# Patient Record
Sex: Male | Born: 2000
Health system: Southern US, Community
[De-identification: ages and names within clinical notes are randomized; demographics above are authoritative.]

## PROBLEM LIST (undated history)

## (undated) DIAGNOSIS — F32A Depression, unspecified: Secondary | ICD-10-CM

## (undated) DIAGNOSIS — L309 Dermatitis, unspecified: Secondary | ICD-10-CM

## (undated) DIAGNOSIS — K219 Gastro-esophageal reflux disease without esophagitis: Secondary | ICD-10-CM

## (undated) DIAGNOSIS — J309 Allergic rhinitis, unspecified: Secondary | ICD-10-CM

## (undated) DIAGNOSIS — J45909 Unspecified asthma, uncomplicated: Secondary | ICD-10-CM

## (undated) DIAGNOSIS — R319 Hematuria, unspecified: Secondary | ICD-10-CM

## (undated) DIAGNOSIS — T783XXA Angioneurotic edema, initial encounter: Secondary | ICD-10-CM

## (undated) DIAGNOSIS — L509 Urticaria, unspecified: Secondary | ICD-10-CM

## (undated) DIAGNOSIS — F419 Anxiety disorder, unspecified: Secondary | ICD-10-CM

## (undated) HISTORY — DX: Anxiety disorder, unspecified: F41.9

## (undated) HISTORY — DX: Unspecified asthma, uncomplicated: J45.909

## (undated) HISTORY — PX: TYMPANOSTOMY TUBE PLACEMENT: SHX32

## (undated) HISTORY — DX: Gastro-esophageal reflux disease without esophagitis: K21.9

## (undated) HISTORY — DX: Angioneurotic edema, initial encounter: T78.3XXA

## (undated) HISTORY — PX: OTHER SURGICAL HISTORY: SHX169

## (undated) HISTORY — DX: Urticaria, unspecified: L50.9

## (undated) HISTORY — DX: Allergic rhinitis, unspecified: J30.9

## (undated) HISTORY — DX: Dermatitis, unspecified: L30.9

## (undated) HISTORY — DX: Depression, unspecified: F32.A

## (undated) HISTORY — DX: Hematuria, unspecified: R31.9

---
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
## 2020-04-29 ENCOUNTER — Other Ambulatory Visit: Payer: Commercial Managed Care - PPO

## 2020-04-29 DIAGNOSIS — D802 Selective deficiency of immunoglobulin A [IgA]: Secondary | ICD-10-CM

## 2020-04-29 DIAGNOSIS — K900 Celiac disease: Secondary | ICD-10-CM

## 2020-04-30 LAB — TISSUE TRANSGLUTAMINASE, IGG: (tTG) Ab, IgG: 44.9 U/mL — ABNORMAL HIGH

## 2020-07-04 ENCOUNTER — Ambulatory Visit: Payer: Commercial Managed Care - PPO | Admitting: Internal Medicine

## 2020-07-04 ENCOUNTER — Other Ambulatory Visit (INDEPENDENT_AMBULATORY_CARE_PROVIDER_SITE_OTHER): Payer: Commercial Managed Care - PPO

## 2020-07-04 ENCOUNTER — Encounter: Payer: Self-pay | Admitting: Internal Medicine

## 2020-07-04 VITALS — BP 120/78 | HR 105 | Ht 65.25 in | Wt 124.0 lb

## 2020-07-04 DIAGNOSIS — D802 Selective deficiency of immunoglobulin A [IgA]: Secondary | ICD-10-CM | POA: Diagnosis not present

## 2020-07-04 DIAGNOSIS — K900 Celiac disease: Secondary | ICD-10-CM

## 2020-07-04 DIAGNOSIS — D804 Selective deficiency of immunoglobulin M [IgM]: Secondary | ICD-10-CM | POA: Diagnosis not present

## 2020-07-04 LAB — COMPREHENSIVE METABOLIC PANEL
ALT: 12 U/L (ref 0–53)
AST: 11 U/L (ref 0–37)
Albumin: 5.3 g/dL — ABNORMAL HIGH (ref 3.5–5.2)
Alkaline Phosphatase: 102 U/L (ref 52–171)
BUN: 15 mg/dL (ref 6–23)
CO2: 25 mEq/L (ref 19–32)
Calcium: 10.4 mg/dL (ref 8.4–10.5)
Chloride: 103 mEq/L (ref 96–112)
Creatinine, Ser: 0.83 mg/dL (ref 0.40–1.50)
GFR: 126.87 mL/min (ref >60.00)
Glucose, Bld: 98 mg/dL (ref 70–99)
Potassium: 3.8 mEq/L (ref 3.5–5.1)
Sodium: 139 mEq/L (ref 135–145)
Total Bilirubin: 0.9 mg/dL (ref 0.2–1.2)
Total Protein: 7.6 g/dL (ref 6.0–8.3)

## 2020-07-04 LAB — CBC
HCT: 46.4 % (ref 36.0–49.0)
Hemoglobin: 16.3 g/dL — ABNORMAL HIGH (ref 12.0–16.0)
MCHC: 35.1 g/dL (ref 31.0–37.0)
MCV: 83.6 fl (ref 78.0–98.0)
Platelets: 291 10*3/uL (ref 150.0–575.0)
RBC: 5.54 Mil/uL (ref 3.80–5.70)
RDW: 12.7 % (ref 11.4–15.5)
WBC: 6.8 10*3/uL (ref 4.5–13.5)

## 2020-07-04 LAB — VITAMIN D 25 HYDROXY (VIT D DEFICIENCY, FRACTURES): VITD: 12.37 ng/mL — ABNORMAL LOW (ref 30.00–100.00)

## 2020-07-04 LAB — VITAMIN B12: Vitamin B-12: 484 pg/mL (ref 211–911)

## 2020-07-04 LAB — FERRITIN: Ferritin: 43.5 ng/mL (ref 22.0–322.0)

## 2020-07-04 NOTE — Patient Instructions (Addendum)
Your provider has requested that you go to the basement level for lab work before leaving today. Press "B" on the elevator. The lab is located at the first door on the left as you exit the elevator  Due to recent changes in healthcare laws, you may see the results of your imaging and laboratory studies on MyChart before your provider has had a chance to review them.  We understand that in some cases there may be results that are confusing or concerning to you. Not all laboratory results come back in the same time frame and the provider may be waiting for multiple results in order to interpret others.  Please give [place] 48 hours in order for your provider to thoroughly review all the results before contacting the office for clarification of your results.   Referral will be sent to Celiac Clinic at [facility]. Dyanne Iha- Office will contact you with an appointment.   Referral has also been sent to  Nutritionist -Jacquelin Hawking RD, LDN. Office will contact you with an appointment.   If you have had not heard from either office within 1-2 weeks, please contact our office.    If you are age 72 or younger, your body mass index should be between 19-25. Your Body mass index is 20.48 kg/m. If this is out of the aformentioned range listed, please consider follow up with your Primary Care Provider.   Thank you for choosing me and [facility].  Dr.Carl Leone Payor

## 2020-07-04 NOTE — Assessment & Plan Note (Signed)
Serology is improved.  Weight is up.  Clinically he is better.  However he is having some food avoidance issues.  I think he would benefit from a celiac expert consultation so we will refer him to Dr. Loreen Freud at [facility].  Dietitian referral with celiac expertise as well.  That is Jacquelin Hawking who works in the celiac clinic at [facility] from what I saw.  He could potentially follow there for a while I can see him back but I know they can offer him more than I can.  His celiac support group may be useful as well.

## 2020-07-04 NOTE — Progress Notes (Signed)
   Bradley Esparza 20 y.o. 2000-03-23 [id number]  Assessment & Plan:   Celiac disease Serology is improved.  Weight is up.  Clinically he is better.  However he is having some food avoidance issues.  I think he would benefit from a celiac expert consultation so we will refer him to Dr. Loreen Freud at [facility].  Dietitian referral with celiac expertise as well.  That is Jacquelin Hawking who works in the celiac clinic at [facility] from what I saw.  He could potentially follow there for a while I can see him back but I know they can offer him more than I can.  His celiac support group may be useful as well.    Low serum IgA and IgM levels (HCC) Has seen allergy and immunology     Subjective:   Chief Complaint: Follow-up of celiac disease  HPI Bradley Esparza is here with his mom.  He has a diagnosis of celiac disease and IgA deficiency.  He reports that he is doing better.  Mom says that he is not eating 3 meals a day and when I questioned him he will skip meals either because he is not hungry but also because it is a struggle to try to figure out what to eat.  His TTG antibody levels are coming down, in September they were 209.5 and in February 44.9.  Weight is up.  School is going well.  Hoping to work this summer.  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 Wt Readings from Last 3 Encounters:  07/04/20 124 lb (56.2 kg) (6 %, Z= -1.52)*  11/28/19 119 lb (54 kg) (4 %, Z= -1.74)*  08/10/19 118 lb 8 oz (53.8 kg) (4 %, Z= -1.71)*   * Growth percentiles are based on CDC (Boys, 2-20 Years) data.     Allergies  Allergen Reactions  . Augmentin [Amoxicillin-Pot Clavulanate] Other (See Comments)    urticaria  . Gluten Meal   . Peanut-Containing Drug Products    No outpatient medications have been marked as taking for the 07/04/20 encounter (Office Visit) with Iva Boop, MD.   Past Medical History:  Diagnosis Date  . Allergic rhinitis   . Angio-edema   . Asthma   . Celiac disease 05/23/2019  . Eczema    . GERD (gastroesophageal reflux disease)   . Hematuria   . 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  . Urticaria    Past Surgical History:  Procedure Laterality Date  . COLONOSCOPY  05/2019  . ESOPHAGOGASTRODUODENOSCOPY  05/2019  . TYMPANOSTOMY TUBE PLACEMENT     Social History   Social History Narrative   2021 graduate [employer].     G [employer] student 2021    never smoker no school problems lives with mom and younger sister   family history includes Allergic rhinitis in his father, mother, and sister; Asthma in his father, mother, paternal grandfather, and sister; Eczema in his father, mother, and sister; GER disease in his father and maternal grandmother; Irritable bowel syndrome in his maternal grandmother and maternal great-grandmother; Urticaria in his mother.   Review of Systems  As per HPI Objective:   Physical Exam BP 120/78   Pulse (!) 105   Ht 5' 5.25" (1.657 m)   Wt 124 lb (56.2 kg)   SpO2 98%   BMI 20.48 kg/m

## 2020-07-04 NOTE — Assessment & Plan Note (Signed)
Has seen allergy and immunology

## 2020-07-05 LAB — TISSUE TRANSGLUTAMINASE, IGA: (tTG) Ab, IgA: <1 U/mL

## 2020-07-05 LAB — TISSUE TRANSGLUTAMINASE, IGG: (tTG) Ab, IgG: 32.7 U/mL — ABNORMAL HIGH

## 2020-07-08 ENCOUNTER — Other Ambulatory Visit: Payer: Self-pay | Admitting: Internal Medicine

## 2020-07-08 ENCOUNTER — Encounter: Payer: Self-pay | Admitting: Internal Medicine

## 2020-07-08 ENCOUNTER — Other Ambulatory Visit (HOSPITAL_COMMUNITY): Payer: Self-pay

## 2020-07-08 ENCOUNTER — Telehealth: Payer: Self-pay

## 2020-07-08 DIAGNOSIS — E559 Vitamin D deficiency, unspecified: Secondary | ICD-10-CM

## 2020-07-08 HISTORY — DX: Vitamin D deficiency, unspecified: E55.9

## 2020-07-08 MED ORDER — VITAMIN D (ERGOCALCIFEROL) 1.25 MG (50000 UNIT) PO CAPS
50000.0000 [IU] | ORAL_CAPSULE | ORAL | 0 refills | Status: AC
  Filled 2020-07-08: qty 4, 28d supply, fill #0
  Filled 2020-08-08: qty 4, 28d supply, fill #1

## 2020-07-08 NOTE — Telephone Encounter (Signed)
Referral for Celiac disease and Nutrition faxed to [phone]. Notes, labs and procedure reports attached to referral. Pt knows to contact office if he does not here from their office in 1-2 weeks regarding referrals.

## 2020-07-11 ENCOUNTER — Other Ambulatory Visit (HOSPITAL_COMMUNITY): Payer: Self-pay

## 2020-07-17 ENCOUNTER — Telehealth: Payer: Self-pay | Admitting: Allergy

## 2020-07-17 NOTE — Telephone Encounter (Signed)
Spoke with pts mom they want to come on and get labs drawn. I have had Doreen Beam in the [place] office print off the request form for me and give it to rochelle as pts mom is going to bring him in tomorrow afternoon for his lab draw so they have results back before visit.

## 2020-07-17 NOTE — Telephone Encounter (Signed)
Patient was seen 08-08-19 and Dr. Selena Batten ordered blood tests for Bradley Esparza. Mom called today and said they never went to have this done and he has an upcoming appointment on 08-07-20. She wants to know if new orders need to be submitted so he can get this done before the appointment.

## 2020-08-01 LAB — STREP PNEUMONIAE 23 SEROTYPES IGG
Pneumo Ab Type 1*: <0.1 ug/mL — ABNORMAL LOW (ref >1.3)
Pneumo Ab Type 12 (12F)*: <0.1 ug/mL — ABNORMAL LOW (ref >1.3)
Pneumo Ab Type 14*: 0.1 ug/mL — ABNORMAL LOW (ref >1.3)
Pneumo Ab Type 17 (17F)*: <0.1 ug/mL — ABNORMAL LOW (ref >1.3)
Pneumo Ab Type 19 (19F)*: 0.5 ug/mL — ABNORMAL LOW (ref >1.3)
Pneumo Ab Type 2*: 0.3 ug/mL — ABNORMAL LOW (ref >1.3)
Pneumo Ab Type 20*: <0.1 ug/mL — ABNORMAL LOW (ref >1.3)
Pneumo Ab Type 22 (22F)*: 0.2 ug/mL — ABNORMAL LOW (ref >1.3)
Pneumo Ab Type 23 (23F)*: 0.1 ug/mL — ABNORMAL LOW (ref >1.3)
Pneumo Ab Type 26 (6B)*: 0.5 ug/mL — ABNORMAL LOW (ref >1.3)
Pneumo Ab Type 3*: 1.6 ug/mL (ref >1.3)
Pneumo Ab Type 34 (10A)*: 0.3 ug/mL — ABNORMAL LOW (ref >1.3)
Pneumo Ab Type 4*: <0.1 ug/mL — ABNORMAL LOW (ref >1.3)
Pneumo Ab Type 43 (11A)*: 0.4 ug/mL — ABNORMAL LOW (ref >1.3)
Pneumo Ab Type 5*: <0.1 ug/mL — ABNORMAL LOW (ref >1.3)
Pneumo Ab Type 51 (7F)*: <0.1 ug/mL — ABNORMAL LOW (ref >1.3)
Pneumo Ab Type 54 (15B)*: 0.4 ug/mL — ABNORMAL LOW (ref >1.3)
Pneumo Ab Type 56 (18C)*: <0.1 ug/mL — ABNORMAL LOW (ref >1.3)
Pneumo Ab Type 57 (19A)*: 0.7 ug/mL — ABNORMAL LOW (ref >1.3)
Pneumo Ab Type 68 (9V)*: <0.1 ug/mL — ABNORMAL LOW (ref >1.3)
Pneumo Ab Type 70 (33F)*: 0.2 ug/mL — ABNORMAL LOW (ref >1.3)
Pneumo Ab Type 8*: 0.3 ug/mL — ABNORMAL LOW (ref >1.3)
Pneumo Ab Type 9 (9N)*: <0.1 ug/mL — ABNORMAL LOW (ref >1.3)

## 2020-08-01 LAB — CBC WITH DIFFERENTIAL/PLATELET
Basophils Absolute: 0.1 10*3/uL (ref 0.0–0.2)
Basos: 2 %
EOS (ABSOLUTE): 0.4 10*3/uL (ref 0.0–0.4)
Eos: 6 %
Hematocrit: 49.5 % (ref 37.5–51.0)
Hemoglobin: 17.2 g/dL (ref 13.0–17.7)
Immature Grans (Abs): 0 10*3/uL (ref 0.0–0.1)
Immature Granulocytes: 0 %
Lymphocytes Absolute: 2.5 10*3/uL (ref 0.7–3.1)
Lymphs: 36 %
MCH: 29.5 pg (ref 26.6–33.0)
MCHC: 34.7 g/dL (ref 31.5–35.7)
MCV: 85 fL (ref 79–97)
Monocytes Absolute: 0.7 10*3/uL (ref 0.1–0.9)
Monocytes: 10 %
Neutrophils Absolute: 3.2 10*3/uL (ref 1.4–7.0)
Neutrophils: 46 %
Platelets: 313 10*3/uL (ref 150–450)
RBC: 5.84 x10E6/uL — ABNORMAL HIGH (ref 4.14–5.80)
RDW: 12 % (ref 11.6–15.4)
WBC: 6.8 10*3/uL (ref 3.4–10.8)

## 2020-08-01 LAB — IGE PEANUT W/COMPONENT REFLEX: Peanut, IgE: <0.1 kU/L

## 2020-08-01 LAB — IGG 1, 2, 3, AND 4
IgG (Immunoglobin G), Serum: 641 mg/dL — ABNORMAL LOW (ref 671–1456)
IgG, Subclass 1: 369 mg/dL (ref 325–846)
IgG, Subclass 2: 137 mg/dL (ref 133–509)
IgG, Subclass 3: 96 mg/dL (ref 19–109)
IgG, Subclass 4: 3 mg/dL (ref 3–104)

## 2020-08-01 LAB — COMPLEMENT, TOTAL: Compl, Total (CH50): 58 U/mL (ref >41)

## 2020-08-01 LAB — DIPHTHERIA / TETANUS ANTIBODY PANEL
Diphtheria Ab: 0.19 IU/mL (ref <0.10)
Tetanus Ab, IgG: 0.21 IU/mL (ref <0.10)

## 2020-08-06 NOTE — Progress Notes (Signed)
Follow Up Note  RE: Bradley Esparza MRN: [id number] DOB: [birth year] Date of Office Visit: 08/07/2020  Referring provider: Aggie Hacker, MD Primary care provider: Pcp, No  Chief Complaint: No chief complaint on file.  History of Present Illness: I had the pleasure of seeing Bradley Esparza for a follow up visit at the [facility] on 08/07/2020. He is a 20 y.o. male, who is being followed for low IgA and IgM, adverse food reaction and history of asthma. His previous allergy office visit was on 08/08/2019 with Dr. Selena Batten. Today is a regular follow up visit.  He is accompanied today by his mother who provided/contributed to the history.   Low serum IgA and IgM levels No antibiotics since the last visit. Had some URI symptoms which resolved without any antibiotics.  Celiac's is under control with avoiding gluten.  No blood transfusion.  Not up to date with flu/covid-19 vaccine.  Adverse food reaction Currently avoiding peanuts and no reactions.  History of asthma Denies any SOB, coughing, wheezing, chest tightness, nocturnal awakenings, ER/urgent care visits or prednisone use since the last visit.  Assessment and Plan: Bradley Esparza is a 20 y.o. male with: Low serum IgA and IgM levels (HCC) Past history - Diagnosed with Celiac's disease and found to have low IgA and IgM. Patient has history of frequent sinus infections, pneumonias, ear infections s/p tympanostomy tubes as a child. Labs 07/20/2019 IgG 668, IgA <2, IgM 31.  Interim history - 2022 IgG 641 and absent pneumococcal titers. No antibiotics the last year.  Keep track of infections/antibiotics use. Recheck IgG level in 1 year - lower end of normal.  Get pneumonia vaccine (pneumovax) at PCP or pharmacy.  If they can't give then let us know and we can check if your insurance will allow [place] to give it to you.  Get bloodwork 4 weeks after pneumonia vaccine to re-check levels. Recommend annual flu vaccine/covid-19  vaccinations.  There is a risk for infusion reactions with blood products in IgA deficient patient and advised him to be aware of this in case he needs them in the future. No past blood transfusions.  Adverse food reaction Past history - Peanuts caused hives and swelling in the past. Tested by [place] allergy in the past.  Interim history - 2022 bloodwork negative to peanuts.   Today's skin testing negative to peanuts.  Continue strict avoidance of peanuts.  If interested we can schedule food challenge to peanuts. You must be off antihistamines for 3-5 days before. Must be in good health and not ill. No vaccines/injections within the past 7 days. Not on any antibiotics. Plan on being in the office for 2-3 hours and must bring in the food you want to do the oral challenge for. You must call to schedule an appointment and specify it's for a food challenge.   For mild symptoms you can take over the counter antihistamines such as Benadryl and monitor symptoms closely. If symptoms worsen or if you have severe symptoms including breathing issues, throat closure, significant swelling, whole body hives, severe diarrhea and vomiting, lightheadedness then inject epinephrine and seek immediate medical care afterwards.  Food action plan in place.  History of asthma Past history - History of asthma but no symptoms and no inhaler use for a few years.  Monitor symptoms.   Return in about 1 year (around 08/07/2021).  No orders of the defined types were placed in this encounter.   Lab Orders  Strep pneumoniae 23 Serotypes IgG  Diagnostics: Skin Testing: Select foods. Negative test to: peanuts.  Results discussed with patient/family.  Food Adult Perc - 08/07/20 1600    Time Antigen Placed 1630    Allergen Manufacturer Waynette Buttery    Location Back    Number of allergen test 3     Control-buffer 50% Glycerol Negative    Control-Histamine 1 mg/ml 2+    1. Peanut Negative           Medication  List:  Current Outpatient Medications  Medication Sig Dispense Refill  . EPINEPHrine 0.3 mg/0.3 mL IJ SOAJ injection Inject 0.3 mg into the muscle as needed for anaphylaxis.    . Vitamin D, Ergocalciferol, (DRISDOL) 1.25 MG (50000 UNIT) CAPS capsule Take 1 capsule (50,000 Units total) by mouth every 7 (seven) days for 8 doses. 8 capsule 0   No current facility-administered medications for this visit.   Allergies: Allergies  Allergen Reactions  . Augmentin [Amoxicillin-Pot Clavulanate] Other (See Comments)    urticaria  . Gluten Meal   . Peanut-Containing Drug Products   . Penicillins Hives and Swelling   I reviewed his past medical history, social history, family history, and environmental history and no significant changes have been reported from his previous visit.  Review of Systems  Constitutional: Negative for appetite change, chills, fever and unexpected weight change.  HENT: Negative for congestion and rhinorrhea.   Eyes: Negative for itching.  Respiratory: Negative for cough, chest tightness, shortness of breath and wheezing.   Cardiovascular: Negative for chest pain.  Gastrointestinal: Negative for abdominal pain.  Genitourinary: Negative for difficulty urinating.  Skin: Negative for rash.  Allergic/Immunologic: Positive for food allergies.  Neurological: Negative for headaches.   Objective: BP 114/60   Pulse (!) 107   Temp 99 F (37.2 C) (Temporal)   Ht 5' 4.96" (1.65 m)   Wt 122 lb 12.8 oz (55.7 kg)   SpO2 97%   BMI 20.46 kg/m  Body mass index is 20.46 kg/m. Physical Exam Vitals and nursing note reviewed.  Constitutional:      Appearance: He is well-developed.  HENT:     Head: Normocephalic and atraumatic.     Right Ear: External ear normal.     Left Ear: External ear normal.     Nose: Nose normal.  Eyes:     Conjunctiva/sclera: Conjunctivae normal.  Cardiovascular:     Rate and Rhythm: Normal rate and regular rhythm.     Heart sounds: Normal heart  sounds. No murmur heard. No friction rub. No gallop.   Pulmonary:     Effort: Pulmonary effort is normal.     Breath sounds: Normal breath sounds. No wheezing or rales.  Abdominal:     Palpations: Abdomen is soft.  Musculoskeletal:     Cervical back: Neck supple.  Skin:    General: Skin is warm.     Findings: No rash.  Neurological:     Mental Status: He is alert and oriented to person, place, and time.  Psychiatric:        Behavior: Behavior normal.    Previous notes and tests were reviewed. The plan was reviewed with the patient/family, and all questions/concerned were addressed.  It was my pleasure to see Bradley Esparza today and participate in his care. Please feel free to contact me with any questions or concerns.  Sincerely,  Wyline Mood, DO Allergy & Immunology  [facility] office: [phone] [facility] office: [phone]

## 2020-08-07 ENCOUNTER — Ambulatory Visit (INDEPENDENT_AMBULATORY_CARE_PROVIDER_SITE_OTHER): Payer: Commercial Managed Care - PPO | Admitting: Allergy

## 2020-08-07 ENCOUNTER — Other Ambulatory Visit: Payer: Self-pay

## 2020-08-07 ENCOUNTER — Encounter: Payer: Self-pay | Admitting: Allergy

## 2020-08-07 VITALS — BP 114/60 | HR 107 | Temp 99.0°F | Ht 64.96 in | Wt 122.8 lb

## 2020-08-07 DIAGNOSIS — D802 Selective deficiency of immunoglobulin A [IgA]: Secondary | ICD-10-CM | POA: Diagnosis not present

## 2020-08-07 DIAGNOSIS — Z8709 Personal history of other diseases of the respiratory system: Secondary | ICD-10-CM | POA: Diagnosis not present

## 2020-08-07 DIAGNOSIS — T781XXD Other adverse food reactions, not elsewhere classified, subsequent encounter: Secondary | ICD-10-CM

## 2020-08-07 DIAGNOSIS — D804 Selective deficiency of immunoglobulin M [IgM]: Secondary | ICD-10-CM

## 2020-08-07 NOTE — Patient Instructions (Addendum)
Immune system Keep track of infections/antibiotics use. Recheck IgG level in 1 year - lower end of normal.  Get pneumonia vaccine (pneumovax) at PCP or pharmacy.  If they can't give then let us know and we can check if your insurance will allow [place] to give it to you.   Get bloodwork 4 weeks after pneumonia vaccine to re-check levels.  Recommend annual flu vaccine/covid-19 vaccinations.   IgA deficiency If you need the following blood products make sure you tell them that you have IgA deficiency because there's a risk for infusion reaction.  Whole blood  Red blood cells  Platelets  Fresh frozen plasma  Cryoprecipitate  Granulocytes  Peanut allergy  Negative skin testing to peanuts.  Continue strict avoidance of peanuts.   If interested we can schedule food challenge to peanuts. You must be off antihistamines for 3-5 days before. Must be in good health and not ill. No vaccines/injections within the past 7 days. Not on any antibiotics. Plan on being in the office for 2-3 hours and must bring in the food you want to do the oral challenge for. You must call to schedule an appointment and specify it's for a food challenge.    For mild symptoms you can take over the counter antihistamines such as Benadryl and monitor symptoms closely. If symptoms worsen or if you have severe symptoms including breathing issues, throat closure, significant swelling, whole body hives, severe diarrhea and vomiting, lightheadedness then inject epinephrine and seek immediate medical care afterwards.  Food action plan in place.   Follow up in 1 year or sooner if needed.  Follow up for peanut challenge.

## 2020-08-07 NOTE — Assessment & Plan Note (Signed)
Past history - History of asthma but no symptoms and no inhaler use for a few years.  Monitor symptoms.

## 2020-08-07 NOTE — Assessment & Plan Note (Signed)
Past history - Diagnosed with Celiac's disease and found to have low IgA and IgM. Patient has history of frequent sinus infections, pneumonias, ear infections s/p tympanostomy tubes as a child. Labs 07/20/2019 IgG 668, IgA <2, IgM 31.  Interim history - 2022 IgG 641 and absent pneumococcal titers. No antibiotics the last year.  Keep track of infections/antibiotics use. Recheck IgG level in 1 year - lower end of normal.  Get pneumonia vaccine (pneumovax) at PCP or pharmacy.  If they can't give then let us know and we can check if your insurance will allow [place] to give it to you.  Get bloodwork 4 weeks after pneumonia vaccine to re-check levels. Recommend annual flu vaccine/covid-19 vaccinations.  There is a risk for infusion reactions with blood products in IgA deficient patient and advised him to be aware of this in case he needs them in the future. No past blood transfusions.

## 2020-08-07 NOTE — Assessment & Plan Note (Signed)
Past history - Peanuts caused hives and swelling in the past. Tested by [place] allergy in the past.  Interim history - 2022 bloodwork negative to peanuts.   Today's skin testing negative to peanuts.  Continue strict avoidance of peanuts.  If interested we can schedule food challenge to peanuts. You must be off antihistamines for 3-5 days before. Must be in good health and not ill. No vaccines/injections within the past 7 days. Not on any antibiotics. Plan on being in the office for 2-3 hours and must bring in the food you want to do the oral challenge for. You must call to schedule an appointment and specify it's for a food challenge.   For mild symptoms you can take over the counter antihistamines such as Benadryl and monitor symptoms closely. If symptoms worsen or if you have severe symptoms including breathing issues, throat closure, significant swelling, whole body hives, severe diarrhea and vomiting, lightheadedness then inject epinephrine and seek immediate medical care afterwards.  Food action plan in place.

## 2020-08-08 ENCOUNTER — Other Ambulatory Visit (HOSPITAL_COMMUNITY): Payer: Self-pay

## 2020-08-16 ENCOUNTER — Other Ambulatory Visit (HOSPITAL_COMMUNITY): Payer: Self-pay

## 2020-09-27 ENCOUNTER — Other Ambulatory Visit: Payer: Self-pay | Admitting: Internal Medicine

## 2020-09-27 ENCOUNTER — Other Ambulatory Visit (INDEPENDENT_AMBULATORY_CARE_PROVIDER_SITE_OTHER): Payer: Commercial Managed Care - PPO

## 2020-09-27 DIAGNOSIS — E559 Vitamin D deficiency, unspecified: Secondary | ICD-10-CM | POA: Diagnosis not present

## 2020-09-27 LAB — VITAMIN D 25 HYDROXY (VIT D DEFICIENCY, FRACTURES): VITD: 25.85 ng/mL — ABNORMAL LOW (ref 30.00–100.00)

## 2020-09-27 NOTE — Progress Notes (Signed)
vit

## 2020-10-04 ENCOUNTER — Other Ambulatory Visit (HOSPITAL_COMMUNITY): Payer: Self-pay

## 2020-10-04 ENCOUNTER — Other Ambulatory Visit: Payer: Self-pay

## 2020-10-04 DIAGNOSIS — E559 Vitamin D deficiency, unspecified: Secondary | ICD-10-CM

## 2020-10-04 DIAGNOSIS — R894 Abnormal immunological findings in specimens from other organs, systems and tissues: Secondary | ICD-10-CM

## 2020-10-04 MED ORDER — VITAMIN D (ERGOCALCIFEROL) 1.25 MG (50000 UNIT) PO CAPS
50000.0000 [IU] | ORAL_CAPSULE | ORAL | 0 refills | Status: DC
  Filled 2020-10-04: qty 4, 28d supply, fill #0
  Filled 2020-11-06: qty 4, 28d supply, fill #1

## 2020-10-07 ENCOUNTER — Encounter (HOSPITAL_COMMUNITY): Payer: Self-pay

## 2020-10-07 ENCOUNTER — Other Ambulatory Visit: Payer: Self-pay

## 2020-10-07 ENCOUNTER — Ambulatory Visit (HOSPITAL_COMMUNITY)
Admission: EM | Admit: 2020-10-07 | Discharge: 2020-10-07 | Disposition: A | Discharge status: Home / Self Care | Payer: Commercial Managed Care - PPO | Attending: Family Medicine | Admitting: Family Medicine

## 2020-10-07 DIAGNOSIS — L551 Sunburn of second degree: Secondary | ICD-10-CM | POA: Diagnosis not present

## 2020-10-07 MED ORDER — SILVER SULFADIAZINE 1 % EX CREA: 1.0000 "application " | TOPICAL_CREAM | Freq: Every day | CUTANEOUS | 2 refills | Status: DC

## 2020-10-07 NOTE — Discharge Instructions (Addendum)
Use nonstick gauze and Coban wrap if areas of blistering pop and are open to help keep them from getting infected.

## 2020-10-07 NOTE — ED Triage Notes (Signed)
Pt reports sunburn in back, arms and shoulder x 4 days. Reports he started developing blisters today.

## 2020-10-07 NOTE — ED Provider Notes (Signed)
[facility]    CSN: [id number] Arrival date & time: 10/07/20  1730      History   Chief Complaint Chief Complaint  Patient presents with   Sunburn    HPI Bradley Esparza is a 20 y.o. male.   Patient presenting today with significant sunburn to upper back, bilateral arms x4 days.  He started developing some blistering today so he decided to come be seen.  Has been treating with aloe at home with no relief.  Denies active drainage, fevers, chills, sweats, weakness.  Past Medical History:  Diagnosis Date   Allergic rhinitis    Angio-edema    Asthma    Celiac disease 05/23/2019   Eczema    GERD (gastroesophageal reflux disease)    Hematuria    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   Urticaria    Vitamin D deficiency 07/08/2020   Patient Active Problem List   Diagnosis Date Noted   Vitamin D deficiency 07/08/2020   Adverse food reaction 08/08/2019   History of asthma 08/08/2019   Low serum IgA and IgM levels (HCC) 05/23/2019   Celiac disease 05/23/2019   Past Surgical History:  Procedure Laterality Date   COLONOSCOPY  05/2019   ESOPHAGOGASTRODUODENOSCOPY  05/2019   TYMPANOSTOMY TUBE PLACEMENT       Home Medications    Prior to Admission medications   Medication Sig Start Date End Date Taking? Authorizing Provider  silver sulfADIAZINE (SILVADENE) 1 % cream Apply 1 application topically daily. 10/07/20  Yes Particia Nearing, PA-C  EPINEPHrine 0.3 mg/0.3 mL IJ SOAJ injection Inject 0.3 mg into the muscle as needed for anaphylaxis.    [provider]  Vitamin D, Ergocalciferol, (DRISDOL) 1.25 MG (50000 UNIT) CAPS capsule Take 1 capsule by mouth every 7 days. 10/04/20   Iva Boop, MD   Family History Family History  Problem Relation Age of Onset   GER disease Father    Asthma Father    Allergic rhinitis Father    Eczema Father    Asthma Sister    Allergic rhinitis Sister    Eczema Sister    GER disease Maternal Grandmother    Irritable bowel  syndrome Maternal Grandmother    Asthma Paternal Grandfather    Irritable bowel syndrome Maternal Great-grandmother    Asthma Mother    Allergic rhinitis Mother    Eczema Mother    Urticaria Mother    Colon cancer Neg Hx    Esophageal cancer Neg Hx    Stomach cancer Neg Hx    Rectal cancer Neg Hx    Social History Social History   Tobacco Use   Smoking status: Never   Smokeless tobacco: Never  Vaping Use   Vaping Use: Never used  Substance Use Topics   Alcohol use: Never   Drug use: Never     Allergies   Augmentin [amoxicillin-pot clavulanate], Gluten meal, Peanut-containing drug products, and Penicillins   Review of Systems Review of Systems Per HPI  Physical Exam Triage Vital Signs ED Triage Vitals  Enc Vitals Group     BP --      Pulse Rate 10/07/20 1843 85     Resp 10/07/20 1843 15     Temp 10/07/20 1843 98.7 F (37.1 C)     Temp Source 10/07/20 1843 Oral     SpO2 10/07/20 1843 100 %     Weight --      Height --      Head Circumference --  Peak Flow --      Pain Score 10/07/20 1838 6     Pain Loc --      Pain Edu? --      Excl. in GC? --    No data found.  Updated Vital Signs Pulse 85   Temp 98.7 F (37.1 C) (Oral)   Resp 15   SpO2 100%   Visual Acuity Right Eye Distance:   Left Eye Distance:   Bilateral Distance:    Right Eye Near:   Left Eye Near:    Bilateral Near:     Physical Exam Vitals and nursing note reviewed.  Constitutional:      Appearance: Normal appearance.  HENT:     Head: Atraumatic.     Mouth/Throat:     Mouth: Mucous membranes are moist.     Pharynx: Oropharynx is clear.  Eyes:     Extraocular Movements: Extraocular movements intact.     Conjunctiva/sclera: Conjunctivae normal.  Cardiovascular:     Rate and Rhythm: Normal rate and regular rhythm.  Pulmonary:     Effort: Pulmonary effort is normal.     Breath sounds: Normal breath sounds.  Musculoskeletal:        General: Normal range of motion.      Cervical back: Normal range of motion and neck supple.  Skin:    General: Skin is warm.     Comments: Diffuse erythema down extensor surface of bilateral upper extremities with scattered blistering, upper back similar, erythema with blistering  Neurological:     General: No focal deficit present.     Mental Status: He is oriented to person, place, and time.  Psychiatric:        Mood and Affect: Mood normal.        Thought Content: Thought content normal.        Judgment: Judgment normal.     UC Treatments / Results  Labs (all labs ordered are listed, but only abnormal results are displayed) Labs Reviewed - No data to display  EKG   Radiology No results found.  Procedures Procedures (including critical care time)  Medications Ordered in UC Medications - No data to display  Initial Impression / Assessment and Plan / UC Course  I have reviewed the triage vital signs and the nursing notes.  Pertinent labs & imaging results that were available during my care of the patient were reviewed by me and considered in my medical decision making (see chart for details).     We will treat with Silvadene, nonstick gauze and Coban to the open areas as a blister start rupturing.  Keep clean, covered.  Over-the-counter pain relievers as needed for pain.  Watch for signs of infection, return if worsening.  Final Clinical Impressions(s) / UC Diagnoses   Final diagnoses:  Sunburn of second degree     Discharge Instructions      Use nonstick gauze and Coban wrap if areas of blistering pop and are open to help keep them from getting infected.     ED Prescriptions     Medication Sig Dispense Auth. Provider   silver sulfADIAZINE (SILVADENE) 1 % cream Apply 1 application topically daily. 50 g Particia Nearing, [place]      PDMP not reviewed this encounter.   Particia Nearing, [place] 10/07/20 1945

## 2020-10-19 LAB — STREP PNEUMONIAE 23 SEROTYPES IGG
Pneumo Ab Type 1*: 0.4 ug/mL — ABNORMAL LOW (ref >1.3)
Pneumo Ab Type 12 (12F)*: 0.1 ug/mL — ABNORMAL LOW (ref >1.3)
Pneumo Ab Type 14*: 6.6 ug/mL (ref >1.3)
Pneumo Ab Type 17 (17F)*: <0.1 ug/mL — ABNORMAL LOW (ref >1.3)
Pneumo Ab Type 19 (19F)*: 0.9 ug/mL — ABNORMAL LOW (ref >1.3)
Pneumo Ab Type 2*: 0.2 ug/mL — ABNORMAL LOW (ref >1.3)
Pneumo Ab Type 20*: 0.1 ug/mL — ABNORMAL LOW (ref >1.3)
Pneumo Ab Type 22 (22F)*: 1.3 ug/mL — ABNORMAL LOW (ref >1.3)
Pneumo Ab Type 23 (23F)*: 0.2 ug/mL — ABNORMAL LOW (ref >1.3)
Pneumo Ab Type 26 (6B)*: 0.4 ug/mL — ABNORMAL LOW (ref >1.3)
Pneumo Ab Type 3*: 1.9 ug/mL (ref >1.3)
Pneumo Ab Type 34 (10A)*: 3.8 ug/mL (ref >1.3)
Pneumo Ab Type 4*: 0.2 ug/mL — ABNORMAL LOW (ref >1.3)
Pneumo Ab Type 43 (11A)*: 0.4 ug/mL — ABNORMAL LOW (ref >1.3)
Pneumo Ab Type 5*: 0.2 ug/mL — ABNORMAL LOW (ref >1.3)
Pneumo Ab Type 51 (7F)*: <0.1 ug/mL — ABNORMAL LOW (ref >1.3)
Pneumo Ab Type 54 (15B)*: 10 ug/mL (ref >1.3)
Pneumo Ab Type 56 (18C)*: 0.8 ug/mL — ABNORMAL LOW (ref >1.3)
Pneumo Ab Type 57 (19A)*: 1.7 ug/mL (ref >1.3)
Pneumo Ab Type 68 (9V)*: 0.1 ug/mL — ABNORMAL LOW (ref >1.3)
Pneumo Ab Type 70 (33F)*: 0.7 ug/mL — ABNORMAL LOW (ref >1.3)
Pneumo Ab Type 8*: 0.6 ug/mL — ABNORMAL LOW (ref >1.3)
Pneumo Ab Type 9 (9N)*: <0.1 ug/mL — ABNORMAL LOW (ref >1.3)

## 2020-11-06 ENCOUNTER — Other Ambulatory Visit (HOSPITAL_COMMUNITY): Payer: Self-pay

## 2020-11-11 ENCOUNTER — Encounter: Payer: Self-pay | Admitting: Family

## 2020-11-11 ENCOUNTER — Ambulatory Visit: Payer: Commercial Managed Care - PPO | Admitting: Family

## 2020-11-11 ENCOUNTER — Other Ambulatory Visit: Payer: Self-pay

## 2020-11-11 VITALS — BP 116/82 | HR 80 | Temp 98.4°F | Resp 18 | Ht 64.96 in | Wt 122.8 lb

## 2020-11-11 DIAGNOSIS — T781XXD Other adverse food reactions, not elsewhere classified, subsequent encounter: Secondary | ICD-10-CM | POA: Diagnosis not present

## 2020-11-11 NOTE — Patient Instructions (Addendum)
Bradley Esparza was able to tolerate the peanut butter food challenge today at the office without adverse signs or symptoms of an allergic reaction. Therefore, he has the same risk of systemic reaction associated with the consumption of peanut products as the general population.  - Do not give any peanut products for the next 24 hours. - Monitor for allergic symptoms such as rash, wheezing, diarrhea, swelling, and vomiting for the next 24 hours. If severe symptoms occur, treat with EpiPen injection and call 911. For less severe symptoms treat with Benadryl 4 teaspoonfuls every 6 hours and call the clinic.  - If no allergic symptoms are evident, reintroduce peanut products into the diet, 1-2 servings a week. If he develops an allergic reaction to peanut products, record what was eaten, the amount eaten, preparation method, time from ingestion to reaction, and symptoms.   Get the Prevnar 13 vaccine is recommended by Dr. Selena Batten.  After you get this vaccine you will need to get lab work to pneumococcal titers in 4 weeks.  Please let us know if this treatment plan is not working well for you. Keep your already scheduled follow-up appointment with Dr. Selena Batten on August 11, 2021 at 3:15 PM

## 2020-11-11 NOTE — Progress Notes (Signed)
[address] [place] [address] Dept: [phone]  FOLLOW UP NOTE  Patient ID: Bradley Esparza, male    DOB: 02-Feb-2001  Age: 20 y.o. MRN: [id number] Date of Office Visit: 11/11/2020  Assessment  Chief Complaint: No chief complaint on file.  HPI Bradley Esparza is a 20 year old male who presents today for oral food challenge to peanut butter.  He was last seen on August 07, 2020 by Dr. Selena Batten for low serum IgA and IgM levels, adverse food reaction, and history of asthma.  His mom is here with him today and helps provide history.  He reports to be in good health today and denies any cardiorespiratory, gastrointestinal, and cutaneous symptoms.  He has been off all antihistamines for the past 3 days.  All questions answered and informed consent signed.  His mom reports that when he was around 41 months old after having peanut butter he had hives and swelling.  He has been avoiding peanuts since.  History of asthma is reported as well controlled.  He denies any coughing, wheezing, tightness in his chest, shortness of breath, and nocturnal awakenings.  He has not had to use his albuterol inhaler since we last saw him.  He reports that he has not yet had his Prevnar 13 vaccine.   Drug Allergies:  Allergies  Allergen Reactions   Augmentin [Amoxicillin-Pot Clavulanate] Other (See Comments)    urticaria   Gluten Meal    Peanut-Containing Drug Products    Penicillins Hives and Swelling    Review of Systems: Review of Systems  Constitutional:  Negative for chills and fever.  HENT:         Denies rhinorrhea, nasal congestion, and post nasal drip  Eyes:        Denies itchy watery eyes  Respiratory:  Negative for cough, shortness of breath and wheezing.   Cardiovascular:  Negative for chest pain and palpitations.  Gastrointestinal:  Negative for abdominal pain, diarrhea, nausea and vomiting.  Genitourinary:  Negative for frequency.  Skin:  Negative for itching and rash.   Neurological:  Negative for headaches.    Physical Exam: BP 116/82   Pulse 80   Temp 98.4 F (36.9 C)   Resp 18   Ht 5' 4.96" (1.65 m)   Wt 122 lb 12.8 oz (55.7 kg)   SpO2 98%   BMI 20.46 kg/m    Physical Exam Exam conducted with a chaperone present.  Constitutional:      Appearance: Normal appearance.  HENT:     Head: Normocephalic and atraumatic.     Comments: Pharynx: Mild cobblestoning noted, eyes normal, ears normal, nose: Bilateral lower turbinates mildly edematous and slightly erythematous with clear drainage noted    Right Ear: Ear canal and external ear normal.     Left Ear: Tympanic membrane, ear canal and external ear normal.     Mouth/Throat:     Mouth: Mucous membranes are moist.     Pharynx: Oropharynx is clear.  Eyes:     Conjunctiva/sclera: Conjunctivae normal.  Cardiovascular:     Rate and Rhythm: Regular rhythm.     Heart sounds: Normal heart sounds.  Pulmonary:     Effort: Pulmonary effort is normal.     Breath sounds: Normal breath sounds.     Comments: Lungs clear to auscultation Musculoskeletal:     Cervical back: Neck supple.  Skin:    General: Skin is warm.     Comments: No rashes or urticarial lesions noted  Neurological:     Mental Status: He is alert and oriented to person, place, and time.  Psychiatric:        Mood and Affect: Mood normal.        Behavior: Behavior normal.        Thought Content: Thought content normal.        Judgment: Judgment normal.    Diagnostics:  Open graded peanut butter oral challenge: The patient was able to tolerate the challenge today without adverse signs or symptoms. Vital signs were stable throughout the challenge and observation period. He received multiple doses separated by 15 minutes, each of which was separated by vitals and a brief physical exam. He received the following doses: lip rub, 1 gm, 2 gm, 4 gm, 8 gm, and 16 gm. He was monitored for 60 minutes following the last dose.   The patient had  negative skin prick test and sIgE tests to peanut  and was able to tolerate the open graded oral challenge today without adverse signs or symptoms. Therefore, he has the same risk of systemic reaction associated with the consumption of peanuts as the general population.   Assessment and Plan: 1. Adverse food reaction, subsequent encounter     No orders of the defined types were placed in this encounter.   Patient Instructions  Bradley Esparza was able to tolerate the peanut butter food challenge today at the office without adverse signs or symptoms of an allergic reaction. Therefore, he has the same risk of systemic reaction associated with the consumption of peanut products as the general population.  - Do not give any peanut products for the next 24 hours. - Monitor for allergic symptoms such as rash, wheezing, diarrhea, swelling, and vomiting for the next 24 hours. If severe symptoms occur, treat with EpiPen injection and call 911. For less severe symptoms treat with Benadryl 4 teaspoonfuls every 6 hours and call the clinic.  - If no allergic symptoms are evident, reintroduce peanut products into the diet, 1-2 servings a week. If he develops an allergic reaction to peanut products, record what was eaten, the amount eaten, preparation method, time from ingestion to reaction, and symptoms.   Get the Prevnar 13 vaccine is recommended by Dr. Selena Batten.  After you get this vaccine you will need to get lab work to pneumococcal titers in 4 weeks.  Please let us know if this treatment plan is not working well for you. Keep your already scheduled follow-up appointment with Dr. Selena Batten on August 11, 2021 at 3:15 PM  Return in about 9 months (around 08/11/2021), or if symptoms worsen or fail to improve.    Thank you for the opportunity to care for this patient.  Please do not hesitate to contact me with questions.  Nehemiah Settle, FNP [facility]

## 2020-12-06 ENCOUNTER — Other Ambulatory Visit: Payer: Self-pay | Admitting: Family

## 2020-12-06 ENCOUNTER — Telehealth: Payer: Self-pay

## 2020-12-06 ENCOUNTER — Telehealth: Payer: Self-pay | Admitting: Allergy

## 2020-12-06 DIAGNOSIS — D804 Selective deficiency of immunoglobulin M [IgM]: Secondary | ICD-10-CM

## 2020-12-06 DIAGNOSIS — D802 Selective deficiency of immunoglobulin A [IgA]: Secondary | ICD-10-CM

## 2020-12-06 MED ORDER — PNEUMOCOCCAL 13-VAL CONJ VACC IM SUSP: 0.5000 mL | Freq: Once | INTRAMUSCULAR | 0 refills | Status: AC

## 2020-12-06 NOTE — Telephone Encounter (Signed)
Chrissy please advise:  Patients mom states she took [facility]. [facility] is [facility] or a note of approval for patient to receive the vaccine. Patients mom states it needs to be [facility] on [address].   [phone]

## 2020-12-06 NOTE — Telephone Encounter (Signed)
Dr. Selena Batten please advise as what you would like to do

## 2020-12-06 NOTE — Telephone Encounter (Signed)
Patients mom states she took [facility]. [facility] is [facility] or a note of approval for patient to receive the vaccine. Patients mom states it needs to [facility] on [address].

## 2020-12-06 NOTE — Telephone Encounter (Signed)
[facility] has been discontinued and has ben replace by  Prevnar 20, which the patient received in June 2022. After prvnar 20 they usually give the pneumovax 23 after a year has passed. Pharmacist stated it is too early for the patient to get the pneumovax 23 at this time.

## 2020-12-06 NOTE — Telephone Encounter (Signed)
Order sent for one time dose of Prevnar 13.   Thank you, Nehemiah Settle, FNP

## 2020-12-09 MED ORDER — PNEUMOCOCCAL VAC POLYVALENT 25 MCG/0.5ML IJ INJ: 0.5000 mL | INJECTION | INTRAMUSCULAR | 0 refills | Status: AC

## 2020-12-09 NOTE — Telephone Encounter (Addendum)
Put an order in for a Pneumovax 23.

## 2020-12-09 NOTE — Telephone Encounter (Signed)
He can get the pneumovax now as he had poor response to the prevnar 20. This is part of his immune work up.   If patient needs a script - please put in order for pneumovax 23 and pend for me to sign.  Thank you.

## 2020-12-09 NOTE — Addendum Note (Signed)
Addended by: Florence Canner on: 12/09/2020 11:16 AM   Modules accepted: Orders

## 2020-12-27 ENCOUNTER — Other Ambulatory Visit (INDEPENDENT_AMBULATORY_CARE_PROVIDER_SITE_OTHER): Payer: Commercial Managed Care - PPO

## 2020-12-27 DIAGNOSIS — E559 Vitamin D deficiency, unspecified: Secondary | ICD-10-CM | POA: Diagnosis not present

## 2020-12-27 DIAGNOSIS — R894 Abnormal immunological findings in specimens from other organs, systems and tissues: Secondary | ICD-10-CM | POA: Diagnosis not present

## 2020-12-27 LAB — VITAMIN D 25 HYDROXY (VIT D DEFICIENCY, FRACTURES): VITD: 32.57 ng/mL (ref 30.00–100.00)

## 2020-12-30 DIAGNOSIS — D802 Selective deficiency of immunoglobulin A [IgA]: Secondary | ICD-10-CM | POA: Insufficient documentation

## 2020-12-30 LAB — TISSUE TRANSGLUTAMINASE, IGG: (tTG) Ab, IgG: 16.9 U/mL — ABNORMAL HIGH

## 2021-01-01 ENCOUNTER — Other Ambulatory Visit: Payer: Self-pay

## 2021-01-01 DIAGNOSIS — E559 Vitamin D deficiency, unspecified: Secondary | ICD-10-CM

## 2021-01-01 DIAGNOSIS — K900 Celiac disease: Secondary | ICD-10-CM

## 2021-01-01 MED ORDER — VITAMIN D 25 MCG (1000 UNIT) PO TABS: 1000.0000 [IU] | ORAL_TABLET | Freq: Every day | ORAL | 3 refills | Status: DC

## 2021-01-01 MED ORDER — VITAMIN D (ERGOCALCIFEROL) 1.25 MG (50000 UNIT) PO CAPS
50000.0000 [IU] | ORAL_CAPSULE | ORAL | 0 refills | Status: DC
Start: 2021-01-01 — End: 2021-03-31

## 2021-03-14 ENCOUNTER — Other Ambulatory Visit (INDEPENDENT_AMBULATORY_CARE_PROVIDER_SITE_OTHER): Payer: Commercial Managed Care - PPO

## 2021-03-14 DIAGNOSIS — K900 Celiac disease: Secondary | ICD-10-CM | POA: Diagnosis not present

## 2021-03-14 DIAGNOSIS — E559 Vitamin D deficiency, unspecified: Secondary | ICD-10-CM

## 2021-03-14 LAB — VITAMIN D 25 HYDROXY (VIT D DEFICIENCY, FRACTURES): VITD: 33.14 ng/mL (ref 30.00–100.00)

## 2021-03-17 LAB — TISSUE TRANSGLUTAMINASE ABS,IGG,IGA
(tTG) Ab, IgA: <1 U/mL
(tTG) Ab, IgG: 16.7 U/mL — ABNORMAL HIGH

## 2021-03-31 ENCOUNTER — Encounter: Payer: Self-pay | Admitting: Internal Medicine

## 2021-03-31 ENCOUNTER — Ambulatory Visit: Payer: Commercial Managed Care - PPO | Admitting: Internal Medicine

## 2021-03-31 VITALS — BP 110/72 | HR 97 | Ht 64.96 in | Wt 128.5 lb

## 2021-03-31 DIAGNOSIS — K900 Celiac disease: Secondary | ICD-10-CM

## 2021-03-31 DIAGNOSIS — E559 Vitamin D deficiency, unspecified: Secondary | ICD-10-CM | POA: Diagnosis not present

## 2021-03-31 NOTE — Progress Notes (Signed)
JYHEIM ARNTZEN 21 y.o. [birth year] [id number]  Assessment & Plan:   Encounter Diagnoses  Name Primary?   Celiac disease Yes   Vitamin D deficiency     We talked about the pros and cons of documenting histologic remission of the celiac disease.  I think his antibody levels are very close to normal and he is feeling well.  I explained that I tend to give the patients the option as to whether to do this or not he is inclined to have an EGD and biopsy so we will proceed.  We reviewed that I had sent him for a dietitian referral and not necessarily a physician consult.  I explained that I can repeat an endoscopy and they are comfortable with that and do not feel like they need to see a celiac specialist.  Consider finding an online dietitian versus someone in the [facility] system that would be in network.  Reviewed the importance for vitamin D supplementation upon return.  He is currently not taking the daily supplement. Subjective:   Chief Complaint: Follow-up of celiac disease  HPI 21 year old man with IgA deficiency and celiac disease who presents with his mom for follow-up.  He was diagnosed in 2021.  His antibody levels are in the 16 range with less than 15  being normal.  He and his mom (also has IgA deficiency and celiac) are working on a gluten-free diet and have not been able to find where gluten might be getting in.  He is not having any diarrhea or abdominal pain problems like he did previously.  Wt Readings from Last 3 Encounters:  03/31/21 128 lb 8 oz (58.3 kg)  11/11/20 122 lb 12.8 oz (55.7 kg) (5 %, Z= -1.63)*  08/07/20 122 lb 12.8 oz (55.7 kg) (5 %, Z= -1.61)*   * Growth percentiles are based on CDC (Boys, 2-20 Years) data.   I had referred over to [facility] to try to see the celiac dietitian.  What he ended up with was a visit with one of the pediatric gastroenterologist who specializes in celiac disease.  She suggested that he needed an endoscopy to confirm histologic  remission.  The dietitian was called away due to an emergency and that part of the encounter did not occur.  Turns out this is out of network with their current insurance.  Mom was asking about referral to [facility] versus other ways to have in network follow-up.  He has a history of vitamin D deficiency and he has been boosted twice with 50,000 units/week.  Level was 12 9 months ago 25 6 months ago and then 32 in October and 33 last week.  He also had labs at [facility] that included a normal TSH and free thyroxine level and B12 level and iron testing.  Allergies  Allergen Reactions   Augmentin [Amoxicillin-Pot Clavulanate] Other (See Comments)    urticaria   Gluten Meal    Peanut-Containing Drug Products    Penicillins Hives and Swelling   Current Meds  Medication Sig   EPINEPHrine 0.3 mg/0.3 mL IJ SOAJ injection Inject 0.3 mg into the muscle as needed for anaphylaxis.   Past Medical History:  Diagnosis Date   Allergic rhinitis    Angio-edema    Asthma    Celiac disease 05/23/2019   Eczema    GERD (gastroesophageal reflux disease)    Hematuria    IgA deficiency ([facility]) 05/23/2019   Urticaria    Vitamin D deficiency 07/08/2020   Past Surgical History:  Procedure Laterality Date   COLONOSCOPY  05/2019   ESOPHAGOGASTRODUODENOSCOPY  05/2019   TYMPANOSTOMY TUBE PLACEMENT     Social History   Social History Narrative   2021 graduate [employer].     G [employer] student [phone] school on hold   never smoker no school problems lives with mom and younger sister   family history includes Allergic rhinitis in his father, mother, and sister; Asthma in his father, mother, paternal grandfather, and sister; Eczema in his father, mother, and sister; GER disease in his father and maternal grandmother; Irritable bowel syndrome in his maternal grandmother and maternal great-grandmother; Urticaria in his mother.   Review of Systems As above  Objective:   Physical Exam BP 110/72    Pulse 97     Ht 5' 4.96" (1.65 m)    Wt 128 lb 8 oz (58.3 kg)    SpO2 98%    BMI 21.41 kg/m  No acute distress thin but well-developed and well-nourished Lungs clear Heart sounds normal Abdomen soft and nontender

## 2021-03-31 NOTE — Patient Instructions (Signed)
  You have been scheduled for an endoscopy. Please follow written instructions given to you at your visit today. If you use inhalers (even only as needed), please bring them with you on the day of your procedure.   I appreciate the opportunity to care for you. Carl Gessner, MD, FACG 

## 2021-05-12 ENCOUNTER — Encounter: Payer: Self-pay | Admitting: Certified Registered Nurse Anesthetist

## 2021-05-13 ENCOUNTER — Encounter: Payer: Self-pay | Admitting: Internal Medicine

## 2021-05-13 ENCOUNTER — Other Ambulatory Visit: Payer: Self-pay

## 2021-05-13 ENCOUNTER — Ambulatory Visit (AMBULATORY_SURGERY_CENTER): Payer: Commercial Managed Care - PPO | Admitting: Internal Medicine

## 2021-05-13 VITALS — BP 131/83 | HR 82 | Temp 98.6°F | Resp 15 | Ht 64.0 in | Wt 128.0 lb

## 2021-05-13 DIAGNOSIS — K900 Celiac disease: Secondary | ICD-10-CM | POA: Diagnosis present

## 2021-05-13 DIAGNOSIS — Z0389 Encounter for observation for other suspected diseases and conditions ruled out: Secondary | ICD-10-CM | POA: Diagnosis not present

## 2021-05-13 MED ORDER — SODIUM CHLORIDE 0.9 % IV SOLN: 500.0000 mL | Freq: Once | INTRAVENOUS | Status: DC

## 2021-05-13 NOTE — Op Note (Signed)
[facility] ?Patient Name: Bradley Esparza ?Procedure Date: 05/13/2021 11:16 AM ?MRN: [id number] ?Endoscopist: Iva Boop , MD ?Age: 21 ?Referring MD:  ?Date of Birth: 16-Nov-2000 ?Gender: Male ?Account #: [id number] ?Procedure:                Upper GI endoscopy ?Indications:              Celiac disease, Follow-up of celiac disease ?Medicines:                Propofol per Anesthesia, Monitored Anesthesia Care ?Procedure:                Pre-Anesthesia Assessment: ?                          - Prior to the procedure, a History and Physical  ?                          was performed, and patient medications and  ?                          allergies were reviewed. The patient's tolerance of  ?                          previous anesthesia was also reviewed. The risks  ?                          and benefits of the procedure and the sedation  ?                          options and risks were discussed with the patient.  ?                          All questions were answered, and informed consent  ?                          was obtained. Prior Anticoagulants: The patient has  ?                          taken no previous anticoagulant or antiplatelet  ?                          agents. ASA Grade Assessment: II - A patient with  ?                          mild systemic disease. After reviewing the risks  ?                          and benefits, the patient was deemed in  ?                          satisfactory condition to undergo the procedure. ?                          After obtaining informed consent, the endoscope was  ?  passed under direct vision. Throughout the  ?                          procedure, the patient's blood pressure, pulse, and  ?                          oxygen saturations were monitored continuously. The  ?                          Endoscope was introduced through the mouth, and  ?                          advanced to the second part of duodenum. The upper  ?                           GI endoscopy was accomplished without difficulty.  ?                          The patient tolerated the procedure well. ?Scope In: ?Scope Out: ?Findings:                 The examined duodenum was normal. Biopsies for  ?                          histology were taken with a cold forceps for  ?                          evaluation of celiac disease. Verification of  ?                          patient identification for the specimen was done.  ?                          Estimated blood loss was minimal. ?                          Patchy mildly erythematous mucosa was found in the  ?                          gastric antrum. ?                          The exam was otherwise without abnormality. ?                          The cardia and gastric fundus were normal on  ?                          retroflexion. ?Complications:            No immediate complications. ?Estimated Blood Loss:     Estimated blood loss was minimal. ?Impression:               - Normal examined duodenum. Biopsied. The scalloped  ?  folds seen prior to gluten-free diet have resolved. ?                          - Erythematous mucosa in the antrum. ?                          - The examination was otherwise normal. ?Recommendation:           - Patient has a contact number available for  ?                          emergencies. The signs and symptoms of potential  ?                          delayed complications were discussed with the  ?                          patient. Return to normal activities tomorrow.  ?                          Written discharge instructions were provided to the  ?                          patient. ?                          - Resume previous diet. ?                          - Continue present medications. ?                          - Await pathology results. ?Iva Booparl E Yuniel Blaney, MD ?05/13/2021 11:28:44 AM ?This report has been signed electronically. ?

## 2021-05-13 NOTE — Patient Instructions (Addendum)
Things look better - I took biopsies and will let you know. ? ?I appreciate the opportunity to care for you. ?Iva Boop, MD, [facility] ? ? ? ?YOU HAD AN ENDOSCOPIC PROCEDURE TODAY AT THE  ENDOSCOPY CENTER:   Refer to the procedure report that was given to you for any specific questions about what was found during the examination.  If the procedure report does not answer your questions, please call your gastroenterologist to clarify.  If you requested that your care partner not be given the details of your procedure findings, then the procedure report has been included in a sealed envelope for you to review at your convenience later. ? ?YOU SHOULD EXPECT: Some feelings of bloating in the abdomen. Passage of more gas than usual.  Walking can help get rid of the air that was put into your GI tract during the procedure and reduce the bloating. If you had a lower endoscopy (such as a colonoscopy or flexible sigmoidoscopy) you may notice spotting of blood in your stool or on the toilet paper. If you underwent a bowel prep for your procedure, you may not have a normal bowel movement for a few days. ? ?Please Note:  You might notice some irritation and congestion in your nose or some drainage.  This is from the oxygen used during your procedure.  There is no need for concern and it should clear up in a day or so. ? ?SYMPTOMS TO REPORT IMMEDIATELY: ? ?Following upper endoscopy (EGD) ? Vomiting of blood or coffee ground material ? New chest pain or pain under the shoulder blades ? Painful or persistently difficult swallowing ? New shortness of breath ? Fever of 100?F or higher ? Black, tarry-looking stools ? ?For urgent or emergent issues, a gastroenterologist can be reached at any hour by calling [phone]. ?Do not use MyChart messaging for urgent concerns.  ? ? ?DIET:  We do recommend a small meal at first, but then you may proceed to your regular diet.  Drink plenty of fluids but you should avoid alcoholic  beverages for 24 hours. ? ?ACTIVITY:  You should plan to take it easy for the rest of today and you should NOT DRIVE or use heavy machinery until tomorrow (because of the sedation medicines used during the test).   ? ?FOLLOW UP: ?Our staff will call the number listed on your records 48-72 hours following your procedure to check on you and address any questions or concerns that you may have regarding the information given to you following your procedure. If we do not reach you, we will leave a message.  We will attempt to reach you two times.  During this call, we will ask if you have developed any symptoms of COVID 19. If you develop any symptoms (ie: fever, flu-like symptoms, shortness of breath, cough etc.) before then, please call [phone].  If you test positive for Covid 19 in the 2 weeks post procedure, please call and report this information to [place].   ? ?If any biopsies were taken you will be contacted by phone or by letter within the next 1-3 weeks.  Please call us at [phone] if you have not heard about the biopsies in 3 weeks.  ? ? ?SIGNATURES/CONFIDENTIALITY: ?You and/or your care partner have signed paperwork which will be entered into your electronic medical record.  These signatures attest to the fact that that the information above on your After Visit Summary has been reviewed and is understood.  Full  responsibility of the confidentiality of this discharge information lies with you and/or your care-partner.  ?

## 2021-05-13 NOTE — Progress Notes (Signed)
Report given to PACU, vss 

## 2021-05-13 NOTE — Progress Notes (Signed)
VS  DT ? ?Pt's states no medical or surgical changes since previsit or office visit. ? ?

## 2021-05-13 NOTE — Progress Notes (Signed)
[facility] History and Physical ? ? ?Primary Care Physician:  Pcp, No ? ? ?Reason for Procedure:   Celiac disease ? ?Plan:    EGD, biopsy of duodenum ? ? ? ? ?HPI: Isaih SHIAH Esparza is a 21 y.o. male w/ celiac disease here for f/u evaluation w/ EGD ? ? ?Past Medical History:  ?Diagnosis Date  ? Allergic rhinitis   ? Angio-edema   ? Asthma   ? Celiac disease 05/23/2019  ? Eczema   ? GERD (gastroesophageal reflux disease)   ? Hematuria   ? 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  ? Urticaria   ? Vitamin D deficiency 07/08/2020  ? ? ?Past Surgical History:  ?Procedure Laterality Date  ? COLONOSCOPY  05/2019  ? ESOPHAGOGASTRODUODENOSCOPY  05/2019  ? TYMPANOSTOMY TUBE PLACEMENT    ? ? ?Prior to Admission medications   ?Medication Sig Start Date End Date Taking? Authorizing Provider  ?cholecalciferol (VITAMIN D3) 25 MCG (1000 UNIT) tablet Take 1 tablet (1,000 Units total) by mouth daily. ?Patient not taking: Reported on 03/31/2021 01/01/21   Iva Boop, MD  ?EPINEPHrine 0.3 mg/0.3 mL IJ SOAJ injection Inject 0.3 mg into the muscle as needed for anaphylaxis.    [provider]  ? ? ?Current Outpatient Medications  ?Medication Sig Dispense Refill  ? cholecalciferol (VITAMIN D3) 25 MCG (1000 UNIT) tablet Take 1 tablet (1,000 Units total) by mouth daily. (Patient not taking: Reported on 03/31/2021) 90 tablet 3  ? EPINEPHrine 0.3 mg/0.3 mL IJ SOAJ injection Inject 0.3 mg into the muscle as needed for anaphylaxis.    ? ?Current Facility-Administered Medications  ?Medication Dose Route Frequency Provider Last Rate Last Admin  ? 0.9 %  sodium chloride infusion  500 mL Intravenous Once Iva Boop, MD      ? ? ?Allergies as of 05/13/2021 - Review Complete 05/13/2021  ?Allergen Reaction Noted  ? Augmentin [amoxicillin-pot clavulanate] Other (See Comments) 04/27/2019  ? Gluten meal  07/20/2019  ? Peanut-containing drug products  05/08/2019  ? Penicillins Hives and Swelling 08/07/2020  ? ? ?Family History  ?Problem  Relation Age of Onset  ? GER disease Father   ? Asthma Father   ? Allergic rhinitis Father   ? Eczema Father   ? Asthma Sister   ? Allergic rhinitis Sister   ? Eczema Sister   ? GER disease Maternal Grandmother   ? Irritable bowel syndrome Maternal Grandmother   ? Asthma Paternal Grandfather   ? Irritable bowel syndrome Maternal Great-grandmother   ? Asthma Mother   ? Allergic rhinitis Mother   ? Eczema Mother   ? Urticaria Mother   ? Colon cancer Neg Hx   ? Esophageal cancer Neg Hx   ? Stomach cancer Neg Hx   ? Rectal cancer Neg Hx   ? ? ?Social History  ? ?Socioeconomic History  ? Marital status: Single  ?  Spouse name: Not on file  ? Number of children: Not on file  ? Years of education: Not on file  ? Highest education level: Not on file  ?Occupational History  ? Not on file  ?Tobacco Use  ? Smoking status: Never  ? Smokeless tobacco: Never  ?Vaping Use  ? Vaping Use: Never used  ?Substance and Sexual Activity  ? Alcohol use: Never  ? Drug use: Never  ? Sexual activity: Not on file  ?Other Topics Concern  ? Not on file  ?Social History Narrative  ? 2021 graduate [employer].    ? G  [employer] student [phone] school on hold  ? never smoker no school problems lives with mom and younger sister  ? ? ?Review of Systems: ? ?All other review of systems negative except as mentioned in the HPI. ? ?Physical Exam: ?Vital signs ?BP 137/82   Pulse 80   Temp 98.6 ?F (37 ?C)   Ht 5\' 4"  (1.626 m)   Wt 128 lb (58.1 kg)   SpO2 100%   BMI 21.97 kg/m?  ? ?General:   Alert,  Well-developed, well-nourished, pleasant and cooperative in NAD ?Lungs:  Clear throughout to auscultation.   ?Heart:  Regular rate and rhythm; no murmurs, clicks, rubs,  or gallops. ?Abdomen:  Soft, nontender and nondistended. Normal bowel sounds.   ?Neuro/Psych:  Alert and cooperative. Normal mood and affect. A and O x 3 ? ? ?@Howard Patton  Simonne Maffucci, MD, Marval Regal ?[facility] ?[phone] (pager) ?05/13/2021 11:14 AM@ ? ?

## 2021-05-15 ENCOUNTER — Telehealth: Payer: Self-pay

## 2021-05-15 NOTE — Telephone Encounter (Signed)
?  Follow up Call- ? ?Call back number 05/13/2021 05/17/2019  ?Post procedure Call Back phone  # [phone]  ?Permission to leave phone message Yes Yes  ?Some recent data might be hidden  ?  ? ?Patient questions: ? ?Do you have a fever, pain , or abdominal swelling? No. ?Pain Score  0 * ? ?Have you tolerated food without any problems? Yes.   ? ?Have you been able to return to your normal activities? Yes.   ? ?Do you have any questions about your discharge instructions: ?Diet   No. ?Medications  No. ?Follow up visit  No. ? ?Do you have questions or concerns about your Care? No. ? ?Actions: ?* If pain score is 4 or above: ?No action needed, pain <4. ? ?Have you developed a fever since your procedure? no ? ?2.   Have you had an respiratory symptoms (SOB or cough) since your procedure? no ? ?3.   Have you tested positive for COVID 19 since your procedure no ? ?4.   Have you had any family members/close contacts diagnosed with the COVID 19 since your procedure?  no ? ? ?If yes to any of these questions please route to Laverna Peace, RN and Karlton Lemon, RN ? ? ? ?

## 2021-05-19 ENCOUNTER — Encounter: Payer: Self-pay | Admitting: Internal Medicine

## 2021-08-10 NOTE — Progress Notes (Unsigned)
Follow Up Note  RE: Bradley Esparza K Benjamin MRN: [id number] DOB: September 25, 2000 Date of Office Visit: 08/11/2021  Referring provider: No ref. provider found Primary care provider: Pcp, No  Chief Complaint: No chief complaint on file.  History of Present Illness: I had the pleasure of seeing Bradley Esparza for a follow up visit at the [facility] on 08/10/2021. He is a 21 y.o. male, who is being followed for low IgA and IgM, adverse food reaction, history of asthma. His previous allergy office visit was on 11/11/2020 with Nehemiah Settlehristine Dale FNP. Today is a regular follow up visit.  Bradley Esparza was able to tolerate the peanut butter food challenge today at the office without adverse signs or symptoms of an allergic reaction. Therefore, he has the same risk of systemic reaction associated with the consumption of peanut products as the general population.  - Do not give any peanut products for the next 24 hours. - Monitor for allergic symptoms such as rash, wheezing, diarrhea, swelling, and vomiting for the next 24 hours. If severe symptoms occur, treat with EpiPen injection and call 911. For less severe symptoms treat with Benadryl 4 teaspoonfuls every 6 hours and call the clinic.  - If no allergic symptoms are evident, reintroduce peanut products into the diet, 1-2 servings a week. If he develops an allergic reaction to peanut products, record what was eaten, the amount eaten, preparation method, time from ingestion to reaction, and symptoms.    Get the Prevnar 13 vaccine is recommended by Dr. Selena BattenKim.  After you get this vaccine you will need to get lab work to pneumococcal titers in 4 weeks.   Please let Bradley Esparza know if this treatment plan is not working well for you. Keep your already scheduled follow-up appointment with Dr. Selena BattenKim on August 11, 2021 at 3:15 PM   Return in about 9 months (around 08/11/2021), or if symptoms worsen or fail to improve.  Low serum IgA and IgM levels (HCC) Past history -  Diagnosed with Celiac's disease and found to have low IgA and IgM. Patient has history of frequent sinus infections, pneumonias, ear infections s/p tympanostomy tubes as a child. Labs 07/20/2019 IgG 668, IgA <2, IgM 31.  Interim history - 2022 IgG 641 and absent pneumococcal titers. No antibiotics the last year.  Keep track of infections/antibiotics use. Recheck IgG level in 1 year - lower end of normal.  Get pneumonia vaccine (pneumovax) at PCP or pharmacy.             If they can't give then let Bradley Esparza know and we can check if your insurance will allow Bradley Esparza to give it to you. Get bloodwork 4 weeks after pneumonia vaccine to re-check levels. Recommend annual flu vaccine/covid-19 vaccinations.  There is a risk for infusion reactions with blood products in IgA deficient patient and advised him to be aware of this in case he needs them in the future. No past blood transfusions.   Adverse food reaction Past history - Peanuts caused hives and swelling in the past. Tested by [place] [facility] in the past.  Interim history - 2022 bloodwork negative to peanuts.  Today's skin testing negative to peanuts. Continue strict avoidance of peanuts. If interested we can schedule food challenge to peanuts. You must be off antihistamines for 3-5 days before. Must be in good health and not ill. No vaccines/injections within the past 7 days. Not on any antibiotics. Plan on being in the office for 2-3 hours and must bring  in the food you want to do the oral challenge for. You must call to schedule an appointment and specify it's for a food challenge.  For mild symptoms you can take over the counter antihistamines such as Benadryl and monitor symptoms closely. If symptoms worsen or if you have severe symptoms including breathing issues, throat closure, significant swelling, whole body hives, severe diarrhea and vomiting, lightheadedness then inject epinephrine and seek immediate medical care afterwards. Food action plan in  place.   History of asthma Past history - History of asthma but no symptoms and no inhaler use for a few years. Monitor symptoms.    Return in about 1 year (around 08/07/2021).  Assessment and Plan: [name] is a 21 y.o. male with: No problem-specific Assessment & Plan notes found for this encounter.  No follow-ups on file.  No orders of the defined types were placed in this encounter.  Lab Orders  No laboratory test(s) ordered today    Diagnostics: Spirometry:  Tracings reviewed. His effort: {Blank single:19197::"Good reproducible efforts.","It was hard to get consistent efforts and there is a question as to whether this reflects a maximal maneuver.","Poor effort, data can not be interpreted."} FVC: ***L FEV1: ***L, ***% predicted FEV1/FVC ratio: ***% Interpretation: {Blank single:19197::"Spirometry consistent with mild obstructive disease","Spirometry consistent with moderate obstructive disease","Spirometry consistent with severe obstructive disease","Spirometry consistent with possible restrictive disease","Spirometry consistent with mixed obstructive and restrictive disease","Spirometry uninterpretable due to technique","Spirometry consistent with normal pattern","No overt abnormalities noted given today's efforts"}.  Please see scanned spirometry results for details.  Skin Testing: {Blank single:19197::"Select foods","Environmental allergy panel","Environmental allergy panel and select foods","Food allergy panel","None","Deferred due to recent antihistamines use"}. *** Results discussed with patient/family.   Medication List:  Current Outpatient Medications  Medication Sig Dispense Refill  . cholecalciferol (VITAMIN D3) 25 MCG (1000 UNIT) tablet Take 1 tablet (1,000 Units total) by mouth daily. (Patient not taking: Reported on 03/31/2021) 90 tablet 3  . EPINEPHrine 0.3 mg/0.3 mL IJ SOAJ injection Inject 0.3 mg into the muscle as needed for anaphylaxis.     No current  facility-administered medications for this visit.   Allergies: Allergies  Allergen Reactions  . Augmentin [Amoxicillin-Pot Clavulanate] Other (See Comments)    urticaria  . Gluten Meal   . Peanut-Containing Drug Products   . Penicillins Hives and Swelling   I reviewed his past medical history, social history, family history, and environmental history and no significant changes have been reported from his previous visit.  Review of Systems  Constitutional:  Negative for appetite change, chills, fever and unexpected weight change.  HENT:  Negative for congestion and rhinorrhea.   Eyes:  Negative for itching.  Respiratory:  Negative for cough, chest tightness, shortness of breath and wheezing.   Cardiovascular:  Negative for chest pain.  Gastrointestinal:  Negative for abdominal pain.  Genitourinary:  Negative for difficulty urinating.  Skin:  Negative for rash.  Neurological:  Negative for headaches.   Objective: There were no vitals taken for this visit. There is no height or weight on file to calculate BMI. Physical Exam Vitals and nursing note reviewed.  Constitutional:      Appearance: He is well-developed.  HENT:     Head: Normocephalic and atraumatic.     Right Ear: External ear normal.     Left Ear: External ear normal.     Nose: Nose normal.  Eyes:     Conjunctiva/sclera: Conjunctivae normal.  Cardiovascular:     Rate and Rhythm: Normal rate and regular rhythm.  Heart sounds: Normal heart sounds. No murmur heard.    No friction rub. No gallop.  Pulmonary:     Effort: Pulmonary effort is normal.     Breath sounds: Normal breath sounds. No wheezing or rales.  Abdominal:     Palpations: Abdomen is soft.  Musculoskeletal:     Cervical back: Neck supple.  Skin:    General: Skin is warm.     Findings: No rash.  Neurological:     Mental Status: He is alert and oriented to person, place, and time.  Psychiatric:        Behavior: Behavior normal.  Previous notes  and tests were reviewed. The plan was reviewed with the patient/family, and all questions/concerned were addressed.  It was my pleasure to see [place] today and participate in his care. Please feel free to contact me with any questions or concerns.  Sincerely,  Wyline Mood, DO Allergy & Immunology  [facility] office: [phone] [facility] office: [phone]

## 2021-08-11 ENCOUNTER — Ambulatory Visit: Payer: Commercial Managed Care - PPO | Admitting: Allergy

## 2021-08-11 ENCOUNTER — Encounter: Payer: Self-pay | Admitting: Allergy

## 2021-08-11 VITALS — BP 118/78 | HR 85 | Temp 98.3°F | Resp 20 | Ht 65.5 in | Wt 130.4 lb

## 2021-08-11 DIAGNOSIS — D804 Selective deficiency of immunoglobulin M [IgM]: Secondary | ICD-10-CM | POA: Diagnosis not present

## 2021-08-11 DIAGNOSIS — D802 Selective deficiency of immunoglobulin A [IgA]: Secondary | ICD-10-CM | POA: Diagnosis not present

## 2021-08-11 DIAGNOSIS — Z8709 Personal history of other diseases of the respiratory system: Secondary | ICD-10-CM

## 2021-08-11 NOTE — Assessment & Plan Note (Signed)
Past history - History of asthma but no symptoms and no inhaler use for a few years. Interim history - used albuterol during URI in February. . If you notice that you need albuterol inhaler during upper respiratory infections let me know.

## 2021-08-11 NOTE — Assessment & Plan Note (Addendum)
Past history - Diagnosed with Celiac's disease and found to have low IgA and IgM. Patient has history of frequent sinus infections, pneumonias, ear infections s/p tympanostomy tubes as a child. Labs 07/20/2019 IgG 668, IgA <2, IgM 31. 2022 IgG 641 and absent pneumococcal titers.  Interim history - no antibiotics this year, got pneumovax and Prevnar; had Covid-19 once, maybe twice - declines Covid-19 vaccination.  Marland Kitchen Keep track of infections/antibiotics use. . Get flu shot every fall.  . Get bloodwork to look at immunoglobulin levels and your response to the second pneumonia vaccine.  There is a risk for infusion reactions with blood products in IgA deficient patient and advised him to be aware of this in case he needs them in the future. No past blood transfusions.

## 2021-08-11 NOTE — Patient Instructions (Addendum)
Immune system Keep track of infections/antibiotics use. Get flu shot every fall.  Get bloodwork to look at immunoglobulin levels and your response to the second pneumonia vaccine.  We are ordering labs, so please allow 1-2 weeks for the results to come back. With the newly implemented Cures Act, the labs might be visible to you at the same time that they become visible to me. However, I will not address the results until all of the results are back, so please be patient.  In the meantime, continue recommendations in your patient instructions, including avoidance measures (if applicable), until you hear from me.  IgA deficiency If you need the following blood products make sure you tell them that you have IgA deficiency because there's a risk for infusion reaction. Whole blood Red blood cells Platelets Fresh frozen plasma Cryoprecipitate Granulocytes  History of asthma If you notice that you need albuterol inhaler during upper respiratory infections let me know.  Sinuses May use saline nasal spray as needed.   Follow up in 1 year or sooner if needed.  Sign up for my chart

## 2021-08-15 LAB — STREP PNEUMONIAE 23 SEROTYPES IGG
Pneumo Ab Type 1*: 0.5 ug/mL — ABNORMAL LOW (ref >1.3)
Pneumo Ab Type 12 (12F)*: <0.1 ug/mL — ABNORMAL LOW (ref >1.3)
Pneumo Ab Type 14*: 3.8 ug/mL (ref >1.3)
Pneumo Ab Type 17 (17F)*: <0.1 ug/mL — ABNORMAL LOW (ref >1.3)
Pneumo Ab Type 19 (19F)*: 3.8 ug/mL (ref >1.3)
Pneumo Ab Type 2*: 0.9 ug/mL — ABNORMAL LOW (ref >1.3)
Pneumo Ab Type 20*: 0.2 ug/mL — ABNORMAL LOW (ref >1.3)
Pneumo Ab Type 22 (22F)*: 1.1 ug/mL — ABNORMAL LOW (ref >1.3)
Pneumo Ab Type 23 (23F)*: <0.1 ug/mL — ABNORMAL LOW (ref >1.3)
Pneumo Ab Type 26 (6B)*: 0.3 ug/mL — ABNORMAL LOW (ref >1.3)
Pneumo Ab Type 3*: 0.4 ug/mL — ABNORMAL LOW (ref >1.3)
Pneumo Ab Type 34 (10A)*: 2.3 ug/mL (ref >1.3)
Pneumo Ab Type 4*: 0.1 ug/mL — ABNORMAL LOW (ref >1.3)
Pneumo Ab Type 43 (11A)*: 0.4 ug/mL — ABNORMAL LOW (ref >1.3)
Pneumo Ab Type 5*: <0.1 ug/mL — ABNORMAL LOW (ref >1.3)
Pneumo Ab Type 51 (7F)*: <0.1 ug/mL — ABNORMAL LOW (ref >1.3)
Pneumo Ab Type 54 (15B)*: 2.7 ug/mL (ref >1.3)
Pneumo Ab Type 56 (18C)*: <0.1 ug/mL — ABNORMAL LOW (ref >1.3)
Pneumo Ab Type 57 (19A)*: 1 ug/mL — ABNORMAL LOW (ref >1.3)
Pneumo Ab Type 68 (9V)*: <0.1 ug/mL — ABNORMAL LOW (ref >1.3)
Pneumo Ab Type 70 (33F)*: 1.4 ug/mL (ref >1.3)
Pneumo Ab Type 8*: 1.6 ug/mL (ref >1.3)
Pneumo Ab Type 9 (9N)*: <0.1 ug/mL — ABNORMAL LOW (ref >1.3)

## 2021-08-15 LAB — IGG, IGA, IGM
IgA/Immunoglobulin A, Serum: <5 mg/dL — ABNORMAL LOW (ref 90–386)
IgG (Immunoglobin G), Serum: 728 mg/dL (ref 603–1613)
IgM (Immunoglobulin M), Srm: 42 mg/dL (ref 20–172)

## 2021-09-03 ENCOUNTER — Telehealth: Payer: Self-pay

## 2021-09-03 NOTE — Telephone Encounter (Signed)
Patient returned call requesting lab results - DOB verified- advised of lab results and provider notation. Patient verbalized understanding, no further questions.

## 2022-03-16 ENCOUNTER — Telehealth: Payer: Self-pay | Admitting: Internal Medicine

## 2022-03-16 NOTE — Telephone Encounter (Signed)
Inbound call from patients mother stating that she wanted to make a follow up appointment for patient. Patient was scheduled for 3/28 at 2:10 and wanted to discuss if he was able to be seen sooner by Dr. Carlean Purl due to patient having blood in stool. Pease advise.

## 2022-03-16 NOTE — Telephone Encounter (Signed)
Spoke with Pt mom Lea. Lea stated that he had two episodes over the weekend with BRB associated with a BM over the weekend. Lea requesting sooner appointment. Pt had no other symptoms: Pt was scheduled to see Dr. Carlean Purl on 04/03/2022 at 1:30: Lea made aware: Pt verbalized understanding with all questions answered.

## 2022-03-16 NOTE — Telephone Encounter (Signed)
Left message to call back  

## 2022-04-03 ENCOUNTER — Other Ambulatory Visit: Payer: BC Managed Care – PPO

## 2022-04-03 ENCOUNTER — Encounter: Payer: Self-pay | Admitting: Internal Medicine

## 2022-04-03 ENCOUNTER — Ambulatory Visit: Payer: BC Managed Care – PPO | Admitting: Internal Medicine

## 2022-04-03 VITALS — BP 138/76 | HR 102 | Ht 66.0 in | Wt 137.0 lb

## 2022-04-03 DIAGNOSIS — K649 Unspecified hemorrhoids: Secondary | ICD-10-CM

## 2022-04-03 DIAGNOSIS — K900 Celiac disease: Secondary | ICD-10-CM | POA: Diagnosis not present

## 2022-04-03 MED ORDER — HYDROCORTISONE (PERIANAL) 2.5 % EX CREA: 1.0000 | TOPICAL_CREAM | Freq: Two times a day (BID) | CUTANEOUS | 0 refills | Status: AC | PRN

## 2022-04-03 NOTE — Progress Notes (Signed)
Bradley Esparza 22 y.o. 03/03/2000 [id number]  Assessment & Plan:   Encounter Diagnoses  Name Primary?   Bleeding hemorrhoids Yes   Celiac disease    Treat hemorrhoids with as needed hydrocortisone cream.  If things worsen consider additional evaluation with anoscopy versus sigmoidoscopy perhaps.  Advised that current treatment for celiac disease includes abstinence from wheat and gluten products.  Perhaps there will be a vaccine in the future.  He was reassured about the slight asymmetry of his rib cage and the protrusion he noted in the left upper quadrant. Orders Placed This Encounter  Procedures   Tissue transglutaminase, IgG     Meds ordered this encounter  Medications   hydrocortisone (ANUSOL-HC) 2.5 % rectal cream    Sig: Place 1 Application rectally 2 (two) times daily as needed for hemorrhoids or anal itching.    Dispense:  30 g    Refill:  0    Follow-up as needed.  Subjective:   Gastroenterology problem summary:  Celiac disease-diagnosed by abnormal IgG antibodies and biopsy March 2021.  Duodenal biopsies normal 05/13/2021.  Follow IgG antibodies as opposed to IgA based due to IgA deficiency.  Antibodies at a low but not normal level in March 2023.  IgA and IgM deficiency-evaluated by and followed by [facility]  Chief Complaint: Rectal bleeding  HPI 23 year old man with celiac disease and issues described above who presents with complaints of several episodes of rectal bleeding since last August.  They are random and without obvious trigger though he wonders if some might be related to eating gluten.  No rectal pain no alteration in bowel habits though sometimes the stools might be hard he thinks.  He still struggles with complete compliance with a gluten-free diet at times and wonders if he will ever be able to eat wheat again though largely he is gluten-free it sounds.  Mom is with him today.   EGD 05/13/2021 normal small bowel  biopsies. Component Ref Range & Units 1 yr ago (03/14/21) 1 yr ago (12/27/20) 1 yr ago (07/04/20) 1 yr ago (07/04/20) 1 yr ago (04/29/20) 2 yr ago (11/28/19) 2 yr ago (07/20/19)  (tTG) Ab, IgG U/mL 16.7 High  16.9 High  CM  32.7 High  CM 44.9 High  CM 209.5 High  CM >100 High  CM    Wt Readings from Last 3 Encounters:  04/03/22 137 lb (62.1 kg)  08/11/21 130 lb 6 oz (59.1 kg)  05/13/21 128 lb (58.1 kg)    Allergies  Allergen Reactions   Augmentin [Amoxicillin-Pot Clavulanate] Other (See Comments)    urticaria   Gluten Meal    Penicillins Hives and Swelling   Current Meds  Medication Sig   cholecalciferol (VITAMIN D3) 25 MCG (1000 UNIT) tablet Take 1 tablet (1,000 Units total) by mouth daily.       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     Attending college   never smoker no  school problems lives with mom and younger sister   family history includes Allergic rhinitis in his father, mother, and sister; Asthma in his father, mother, paternal grandfather, and sister; Eczema in his father, mother, and sister; GER disease in his father and maternal grandmother; Irritable bowel syndrome in his maternal grandmother and maternal great-grandmother; Urticaria in his mother.   Review of Systems As per HPI  Objective:   Physical Exam BP 138/76   Pulse (!) 102   Ht 5\' 6"  (1.676 m)   Wt 137 lb (62.1 kg)   BMI 22.11 kg/m   Patti Martinique, CMA present.  Slight assymetric protrusion of left anterior ribs  Rectal - LL external hemorrhoid  seen - small but violaceous w/ bleeding stigmata - DRE soft brown stool, nontende no mass or blood

## 2022-04-03 NOTE — Patient Instructions (Signed)
We have sent the following medications to your pharmacy for you to pick up at your convenience: Hydrocortisone cream  Your provider has requested that you go to the basement level for lab work before leaving today. Press "B" on the elevator. The lab is located at the first door on the left as you exit the elevator.   Due to recent changes in healthcare laws, you may see the results of your imaging and laboratory studies on MyChart before your provider has had a chance to review them.  We understand that in some cases there may be results that are confusing or concerning to you. Not all laboratory results come back in the same time frame and the provider may be waiting for multiple results in order to interpret others.  Please give [place] 48 hours in order for your provider to thoroughly review all the results before contacting the office for clarification of your results.    I appreciate the opportunity to care for you. Silvano Rusk, MD, [facility]

## 2022-04-04 LAB — TISSUE TRANSGLUTAMINASE, IGG: (tTG) Ab, IgG: 6.3 U/mL

## 2022-05-28 ENCOUNTER — Ambulatory Visit: Payer: Commercial Managed Care - PPO | Admitting: Internal Medicine

## 2022-12-22 ENCOUNTER — Ambulatory Visit: Payer: BC Managed Care – PPO | Admitting: Internal Medicine

## 2022-12-22 ENCOUNTER — Encounter: Payer: Self-pay | Admitting: Internal Medicine

## 2022-12-22 VITALS — BP 136/64 | HR 72 | Temp 98.0°F | Ht 66.0 in | Wt 135.0 lb

## 2022-12-22 DIAGNOSIS — Z0001 Encounter for general adult medical examination with abnormal findings: Secondary | ICD-10-CM

## 2022-12-22 DIAGNOSIS — K900 Celiac disease: Secondary | ICD-10-CM

## 2022-12-22 DIAGNOSIS — Z23 Encounter for immunization: Secondary | ICD-10-CM | POA: Diagnosis not present

## 2022-12-22 DIAGNOSIS — D804 Selective deficiency of immunoglobulin M [IgM]: Secondary | ICD-10-CM | POA: Diagnosis not present

## 2022-12-22 DIAGNOSIS — Z0000 Encounter for general adult medical examination without abnormal findings: Secondary | ICD-10-CM

## 2022-12-22 DIAGNOSIS — Z119 Encounter for screening for infectious and parasitic diseases, unspecified: Secondary | ICD-10-CM | POA: Diagnosis not present

## 2022-12-22 DIAGNOSIS — E785 Hyperlipidemia, unspecified: Secondary | ICD-10-CM

## 2022-12-22 DIAGNOSIS — H9313 Tinnitus, bilateral: Secondary | ICD-10-CM

## 2022-12-22 DIAGNOSIS — E559 Vitamin D deficiency, unspecified: Secondary | ICD-10-CM

## 2022-12-22 DIAGNOSIS — D802 Selective deficiency of immunoglobulin A [IgA]: Secondary | ICD-10-CM

## 2022-12-22 LAB — COMPREHENSIVE METABOLIC PANEL
ALT: 32 U/L (ref 0–53)
AST: 25 U/L (ref 0–37)
Albumin: 5.3 g/dL — ABNORMAL HIGH (ref 3.5–5.2)
Alkaline Phosphatase: 115 U/L (ref 39–117)
BUN: 12 mg/dL (ref 6–23)
CO2: 24 meq/L (ref 19–32)
Calcium: 10.2 mg/dL (ref 8.4–10.5)
Chloride: 104 meq/L (ref 96–112)
Creatinine, Ser: 0.8 mg/dL (ref 0.40–1.50)
GFR: 126.09 mL/min (ref >60.00)
Glucose, Bld: 99 mg/dL (ref 70–99)
Potassium: 4.1 meq/L (ref 3.5–5.1)
Sodium: 139 meq/L (ref 135–145)
Total Bilirubin: 1 mg/dL (ref 0.2–1.2)
Total Protein: 7.4 g/dL (ref 6.0–8.3)

## 2022-12-22 LAB — CBC WITH DIFFERENTIAL/PLATELET
Basophils Absolute: 0.1 10*3/uL (ref 0.0–0.1)
Basophils Relative: 1.1 % (ref 0.0–3.0)
Eosinophils Absolute: 0.5 10*3/uL (ref 0.0–0.7)
Eosinophils Relative: 6.4 % — ABNORMAL HIGH (ref 0.0–5.0)
HCT: 48.1 % (ref 39.0–52.0)
Hemoglobin: 16.2 g/dL (ref 13.0–17.0)
Lymphocytes Relative: 23.4 % (ref 12.0–46.0)
Lymphs Abs: 1.7 10*3/uL (ref 0.7–4.0)
MCHC: 33.6 g/dL (ref 30.0–36.0)
MCV: 85.9 fL (ref 78.0–100.0)
Monocytes Absolute: 0.6 10*3/uL (ref 0.1–1.0)
Monocytes Relative: 8.6 % (ref 3.0–12.0)
Neutro Abs: 4.4 10*3/uL (ref 1.4–7.7)
Neutrophils Relative %: 60.5 % (ref 43.0–77.0)
Platelets: 294 10*3/uL (ref 150.0–400.0)
RBC: 5.6 Mil/uL (ref 4.22–5.81)
RDW: 12.4 % (ref 11.5–15.5)
WBC: 7.2 10*3/uL (ref 4.0–10.5)

## 2022-12-22 LAB — VITAMIN D 25 HYDROXY (VIT D DEFICIENCY, FRACTURES): VITD: 56.94 ng/mL (ref 30.00–100.00)

## 2022-12-22 LAB — LIPID PANEL
Cholesterol: 199 mg/dL (ref 0–200)
HDL: 50.2 mg/dL (ref >39.00)
LDL Cholesterol: 118 mg/dL — ABNORMAL HIGH (ref 0–99)
NonHDL: 149.17
Total CHOL/HDL Ratio: 4
Triglycerides: 156 mg/dL — ABNORMAL HIGH (ref 0.0–149.0)
VLDL: 31.2 mg/dL (ref 0.0–40.0)

## 2022-12-22 LAB — IBC + FERRITIN
Ferritin: 77.5 ng/mL (ref 22.0–322.0)
Iron: 172 ug/dL — ABNORMAL HIGH (ref 42–165)
Saturation Ratios: 45.5 % (ref 20.0–50.0)
TIBC: 378 ug/dL (ref 250.0–450.0)
Transferrin: 270 mg/dL (ref 212.0–360.0)

## 2022-12-22 LAB — FERRITIN: Ferritin: 75.7 ng/mL (ref 22.0–322.0)

## 2022-12-22 LAB — TSH: TSH: 1.26 u[IU]/mL (ref 0.35–5.50)

## 2022-12-22 NOTE — Progress Notes (Signed)
[facility]: [phone]   -- Annual Preventive Medical Office Visit --  Patient:  Bradley Esparza      Age: 22 y.o.       Sex:  male  Date:   12/22/2022 Patient Care Team: Lula Olszewski, MD as PCP - General (Internal Medicine) Ellamae Sia, DO as Consulting Physician (Allergy) Today's Healthcare Provider: Lula Olszewski, MD   Chief Complaint  Patient presents with   New Patient (Initial Visit)    Purpose of Visit: Comprehensive preventive health assessment and personalized health maintenance planning.  This encounter was conducted as a Comprehensive Physical Exam (CPE) preventive care annual visit. The patient's medical history and problem list were reviewed to inform individualized preventive care recommendations.   No problem-specific medical treatment was provided during this visit.    Assessment & Plan Encounter for annual general medical examination with abnormal findings in adult  Preventative health care  Screening examination for infectious disease  Vitamin D deficiency  Need for Tdap vaccination  IgA deficiency (HCC)  Celiac disease  Low serum IgA and IgM levels (HCC)  Hyperlipidemia, unspecified hyperlipidemia type  Encounter for annual health examination  Tinnitus of both ears  Assessment and Plan    Celiac Disease   Well controlled on a gluten-free diet, he has a history of vitamin D deficiency and potential deficiencies in other vitamins due to malabsorption. We will continue the gluten-free diet and the over-the-counter Vitamin D supplement with meals for better absorption. Labs to check Vitamin D, Ferritin, and consider B12 levels are ordered. We will also consider a multivitamin with minerals for supplementation.  Immunoglobulin Deficiencies (IgG, IgA)   He has a history of immunoglobulin deficiencies, potentially related to celiac disease but is currently asymptomatic. Monitoring will continue with an allergist. He is  advised to avoid large crowds and potential exposure to communicable diseases due to his immunocompromised state.  Tinnitus   He has long-term bilateral tinnitus, potentially related to vitamin deficiency. An ENT referral will be considered if tinnitus does not improve with vitamin supplementation.  General Health Maintenance   The Tdap vaccine will be administered today. He is encouraged to have regular dental and eye exams and advised on the importance of preventive care and consistent vitamin supplementation. Basic preventive labs including a cholesterol panel are ordered. Although he declined genetic testing or referral to a genetic specialist for evaluation of potential genetic syndrome related to various health issues, he is advised on self-examination for testicular lumps. Safe behaviors including seatbelt use, avoiding texting while driving, and avoiding high-risk activities requiring helmets are encouraged. He is advised on the potential benefits of health and sleep trackers, nasal cleaning products for allergy prevention, sun protection, and self-monitoring for signs of melanoma. Maintaining a healthy weight and the importance of financial security for health outcomes are emphasized. The importance of regular sleep is highlighted, and the Snorlab app for sleep monitoring is offered. The potential benefits of fish oil gummies are discussed. Labs will be rechecked next year or sooner if he becomes symptomatic.      Diagnoses and all orders for this visit: Screening examination for infectious disease -     Hepatitis C Antibody -     HIV antibody (with reflex) Vitamin D deficiency -     Vitamin D (25 hydroxy) Need for Tdap vaccination -     Tdap vaccine greater than or equal to 7yo IM IgA deficiency (HCC) -     Hepatitis C Antibody -  HIV antibody (with reflex) -     Vitamin D (25 hydroxy) -     CBC w/Diff -     Comp Met (CMET) -     Lipid panel -     TSH -     Tdap vaccine greater  than or equal to 7yo IM -     Ferritin Celiac disease -     Hepatitis C Antibody -     HIV antibody (with reflex) -     Vitamin D (25 hydroxy) -     CBC w/Diff -     Comp Met (CMET) -     Lipid panel -     TSH -     Tdap vaccine greater than or equal to 7yo IM -     Ferritin Low serum IgA and IgM levels (HCC) -     Hepatitis C Antibody -     HIV antibody (with reflex) -     Vitamin D (25 hydroxy) -     CBC w/Diff -     Comp Met (CMET) -     Lipid panel -     TSH -     Tdap vaccine greater than or equal to 7yo IM -     Ferritin Hyperlipidemia, unspecified hyperlipidemia type Preventative health care Encounter for annual general medical examination with abnormal findings in adult Encounter for annual health examination Tinnitus of both ears     Today's Health Maintenance Counseling and Anticipatory Guidance:  Eye exams:  We encouraged patient to to complete eye exam every 1-2 years.  He reports his last eye exam was:  not yet  Dental health:  He reports he has been keeping up with his dental visits.  He intends to do so in the future.  We encourage regular tooth brushing/flossing. Sinus health:  We encouraged sterile saline nasal misting sinus rinses daily for pollen, to reduce allergies and risk for sinus infections.   Sterile can based misting products are recommended due to superior misting and ease of maintaining sterility Sleep Apnea screening:  We encourage self monitoring of sleep quality with SnoreLab App and other tools/apps that are now available at retail Patient indicates they have NOT been experiencing any  Tired, fatigued, or sleepy during the daytime Cardiovascular Risk Factor Reduction:   Advised patient of need for regular exercise and diet rich and fruits and vegetables and healthy fats to reduce risk of heart attack and stroke.  Avoid first- and second-hand smoke and stimulants.   Avoid extreme exercise- exercise in moderation (150 minutes per week is a good  goal) Wt Readings from Last 3 Encounters:  12/22/22 135 lb (61.2 kg)  04/03/22 137 lb (62.1 kg)  08/11/21 130 lb 6 oz (59.1 kg)  Body mass index is 21.79 kg/m. Health maintenance and immunizations reviewed and he was encouraged to complete anything that is due: Immunization History  Administered Date(s) Administered   DTaP 03/10/2001, 05/10/2001, 06/28/2001, 04/05/2002   Dtap, Unspecified 01/28/2005   HIB, Unspecified 03/10/2001, 05/10/2001, 06/28/2001, 04/05/2002   Hep A, Unspecified 01/14/2006, 12/30/2006   Hep B, Unspecified Jul 24, 2000, 02/10/2001, 09/27/2001   IPV 03/10/2001, 05/10/2001, 09/27/2001   Influenza, Seasonal, Injecte, Preservative Fre 01/09/2009, 01/09/2010, 01/12/2011   Influenza,inj,Quad PF,6+ Mos 01/19/2012, 01/18/2013, 11/23/2018   Influenza-Unspecified 01/10/2002   MMR 01/10/2002, 01/28/2005   Meningococcal B, OMV 11/05/2017, 11/23/2018   Meningococcal Conjugate 01/19/2012, 11/05/2017   PNEUMOCOCCAL CONJUGATE-20 08/21/2020   Pneumococcal Conjugate PCV 7 03/10/2001, 05/10/2001, 06/28/2001, 01/10/2002  Polio, Unspecified 01/28/2005   Tdap 01/19/2012, 12/22/2022   Varicella 01/10/2002, 01/28/2005   Health Maintenance Due  Topic Date Due   HPV VACCINES (1 - Male 3-dose series) Never done   HIV Screening  Never done   Hepatitis C Screening  Never done    Lifestyle / Social History Reviewed:  Social History   Tobacco Use   Smoking status: Never   Smokeless tobacco: Never  Vaping Use   Vaping status: Never Used  Substance Use Topics   Alcohol use: Never   Drug use: Never    My recommendation is total abstinence from all substances of abuse including smoke and 2nd hand smoke, alcohol, illicit drugs, smoking, inhalants, sugar, high risk sexual behavior Offered to assist with any use disorders or addictions.   Sexual transmitted infection testing offered today, but patient declined as he feels he is low risk based on his sexual history.   Social History    Substance and Sexual Activity  Sexual Activity Not Currently  .      12/22/2022   10:33 AM  Depression screen PHQ 2/9  Decreased Interest 0  Down, Depressed, Hopeless 0  PHQ - 2 Score 0  Altered sleeping 0  Tired, decreased energy 1  Change in appetite 1  Feeling bad or failure about yourself  1  Trouble concentrating 0  Moving slowly or fidgety/restless 0  Suicidal thoughts 0  PHQ-9 Score 3  Difficult doing work/chores Somewhat difficult   Injury prevention: Discussed safety belts, safety helmets, not texting and driving.  Today's Cancer Screening  Penile/Testicle/Scrotum cancer screening:advised patient encouraged patient to self screen Thyroid cancer screening: negative thyroid palpation today Colon cancer screening:  had colonoscopy to discover celiac Skin cancer screening-  Advised regular sunscreen use. He denies worrisome, changing, or new skin lesions. Showed him pictures of melanomas for reference:  Return to care in 1 year for next preventative visit.      Subjective  AI-Extracted: Discussed the use of AI scribe software for clinical note transcription with the patient, who gave verbal consent to proceed.  History of Present Illness   The patient, a medical coder, presents with a history of celiac disease, IgG and IgA deficiencies, and a past surgical intervention for urethral widening. The patient reports adherence to a gluten-free diet, which has led to resolution of previous gastrointestinal symptoms, including diarrhea and constipation. The patient also reports a history of tinnitus in both ears, predating the diagnosis of celiac disease.  The patient has a notable musculoskeletal abnormality, specifically a pronounced lower neck bone, which has been present since childhood. The patient denies any discomfort or functional impairment related to this.  The patient also has a history of vitamin D deficiency, which is currently managed with over-the-counter  supplements.  The patient's celiac disease is well-managed with a gluten-free diet, and the patient reports significant improvement in gastrointestinal symptoms since dietary changes were implemented. The patient has not had any recent flare-ups or complications related to celiac disease.  The patient's IgG and IgA deficiencies are currently under the care of an allergist. The patient does not report any recent infections or complications related to these deficiencies.  The patient's past surgical intervention for urethral widening was performed in childhood due to difficulties with urination. The patient reports no current issues with urination and has not required any recent urological follow-up.  The patient is currently not on any prescription medications but uses over-the-counter vitamin D supplements. The patient does not report any  allergies to medications. The patient maintains an active lifestyle, with a particular interest in soccer and basketball. The patient does not report any use of tobacco, alcohol, or illicit substances.      ROSA comprehensive ROS was negative for any concerning symptoms.  Disclaimer about ROS at Annual Preventive Visits Patients are informed before the Review of Systems (ROS) that identifying significant medical issues during the wellness visit may require immediate attention, potentially resulting in a separate billable encounter beyond the scope of the preventive exam. This disclosure is mandated by professional ethics and legal obligations, as healthcare providers must address any substantial health concerns raised during any patient interaction.  A comprehensive ROS is required by insurance companies for billing the visit. However, this structure may inadvertently discourage patients from fully disclosing health concerns due to potential financial implications. Consequently, patients often emphasize that any positive ROS findings are related to stable chronic  conditions, requesting that these not be discussed during the preventive visit to avoid additional charges. Patients may also ask that reported complaints not be listed in the ROS to prevent affecting billing.  Problem list overviews that were updated at today's visit: Problem  Tinnitus of Both Ears   I attest that I have reviewed and confirmed the patients current medications to meet the medication reconciliation requirement  Current Outpatient Medications on File Prior to Visit  Medication Sig   hydrocortisone (ANUSOL-HC) 2.5 % rectal cream Place 1 Application rectally 2 (two) times daily as needed for hemorrhoids or anal itching. (Patient taking differently: Place 1 Application rectally 2 (two) times daily as needed for hemorrhoids or anal itching (As needed).)   VITAMIN D PO Take 125 mg by mouth daily.   No current facility-administered medications on file prior to visit.   Medications Discontinued During This Encounter  Medication Reason   cholecalciferol (VITAMIN D3) 25 MCG (1000 UNIT) tablet Patient Preference   The following were reviewed and entered/updated into our electronic MEDICAL RECORD NUMBERPast Medical History:  Diagnosis Date   Allergic rhinitis    Angio-edema    Anxiety    Asthma    Celiac disease 05/23/2019   Depression    Eczema    GERD (gastroesophageal reflux disease)    Hematuria    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   Urticaria    Vitamin D deficiency 07/08/2020   Patient Active Problem List   Diagnosis Date Noted   Tinnitus of both ears 12/22/2022   IgA deficiency (HCC) 12/30/2020   Vitamin D deficiency 07/08/2020   History of asthma 08/08/2019   Low serum IgA and IgM levels (HCC) 05/23/2019   Celiac disease 05/23/2019   Past Surgical History:  Procedure Laterality Date   COLONOSCOPY  05/2019   CYSTECTOMY  2006   eyebrow   ESOPHAGOGASTRODUODENOSCOPY  05/2019   TYMPANOSTOMY TUBE PLACEMENT     Urethoplasty     Family History  Problem Relation Age  of Onset   Depression Mother    Asthma Mother    Allergic rhinitis Mother    Eczema Mother    Urticaria Mother    GER disease Father    Asthma Father    Allergic rhinitis Father    Eczema Father    Asthma Sister    Allergic rhinitis Sister    Eczema Sister    Hypertension Maternal Grandmother    Depression Maternal Grandmother    GER disease Maternal Grandmother    Irritable bowel syndrome Maternal Grandmother    Asthma Paternal Grandfather  Stroke Paternal Grandfather    Irritable bowel syndrome Maternal Great-grandmother    Colon cancer Neg Hx    Esophageal cancer Neg Hx    Stomach cancer Neg Hx    Rectal cancer Neg Hx    Allergies  Allergen Reactions   Augmentin [Amoxicillin-Pot Clavulanate] Other (See Comments)    urticaria   Gluten Meal Other (See Comments)    GI intolerance (upset stomach)   Penicillins Hives and Swelling    Objective  BP 136/64   Pulse 72   Temp 98 F (36.7 C)   Ht 5\' 6"  (1.676 m)   Wt 135 lb (61.2 kg)   SpO2 98%   BMI 21.79 kg/m   Body mass index is 21.79 kg/m. Wt Readings from Last 3 Encounters:  12/22/22 135 lb (61.2 kg)  04/03/22 137 lb (62.1 kg)  08/11/21 130 lb 6 oz (59.1 kg)   Physical Exam   MEASUREMENTS: WT- 135 HEENT: Tonsils enlarged, no signs of infection. NECK:  muscular neck. ABDOMEN: No tenderness or lumps palpated. No signs of hernia on inspection. EXTREMITIES: No edema. MUSCULOSKELETAL: Musculoskeletal abnormality with a larger than normal bottom neck bone. SKIN: No melanoma or irregular moles observed, normal moles present.     GEN: NAD, Resting Comfortably. HEENT: Tympanic membranes normal appearing bilaterally, Oropharynx clear, No Thyromegaly noted. No palpable lymphadenopathy or thyroid nodules. CARDIOVASCULAR: S1 and S2 heart sounds have regular rate and rhythm with no murmurs appreciated. PULMONARY:  Normal work of breathing. Clear to auscultation bilaterally with no crackles, wheezes, or  rhonchi. ABDOMEN: Soft, Nontender, Nondistended.  MSK: No edema, cyanosis, or clubbing noted. SKIN: Warm, dry, no lesions of concern observed. NEURO: CN2-12 grossly intact. Strength 5/5 in upper and lower extremities. Reflexes symmetric and intact bilaterally.  PSYCH: Normal affect and thought content, pleasant and cooperative.

## 2022-12-22 NOTE — Patient Instructions (Addendum)
VISIT SUMMARY:  During your visit, we discussed your celiac disease, immunoglobulin deficiencies, and tinnitus. Your celiac disease is well-managed with a gluten-free diet, and we will continue this along with your over-the-counter Vitamin D supplement. We also ordered labs to check your Vitamin D, Ferritin, and B12 levels. Your immunoglobulin deficiencies are currently asymptomatic and will continue to be monitored by an allergist. You are advised to avoid large crowds to reduce your risk of infection. Your long-term tinnitus may improve with vitamin supplementation, and we may consider a referral to an ENT specialist if it does not improve.  YOUR PLAN:  -CELIAC DISEASE: Celiac disease is a condition where your body reacts to gluten, a protein found in wheat, barley, and rye, damaging your small intestine. We will continue your gluten-free diet and Vitamin D supplement. We have also ordered labs to check your Vitamin D, Ferritin, and B12 levels, and may consider a multivitamin with minerals for supplementation.  -IMMUNOGLOBULIN DEFICIENCIES: Immunoglobulin deficiencies are conditions where your body does not produce enough antibodies, which help fight off infections. You will continue to be monitored by an allergist, and you are advised to avoid large crowds to reduce your risk of infection.  -TINNITUS: Tinnitus is a condition where you hear ringing or other noises in your ears. Your tinnitus may improve with vitamin supplementation, and we may consider a referral to an ENT specialist if it does not improve.  INSTRUCTIONS:  You received the Tdap vaccine today. Please continue to have regular dental and eye exams, and remember the importance of preventive care and consistent vitamin supplementation. We have ordered basic preventive labs including a cholesterol panel. Please continue to practice safe behaviors such as using a seatbelt, avoiding texting while driving, and avoiding high-risk activities  requiring helmets. Consider using health and sleep trackers, nasal cleaning products for allergy prevention, sun protection, and self-monitoring for signs of melanoma. Maintain a healthy weight and ensure regular sleep. We discussed the potential benefits of fish oil gummies. Your labs will be rechecked next year or sooner if you become symptomatic.   Welcome aboard!   Today's visit was a valuable first step in understanding your health and starting your personalized care journey. We discussed your medical history and medications in detail. Given the extensive information, we prioritized addressing your most pressing concerns.  We understood those concerns to be:  New Patient (Initial Visit)   Building a Complete Picture  To create the most effective care plan possible, we may need additional information from previous providers. We encouraged you to gather any relevant medical records for your next visit. This will help [place] build a more complete picture and develop a personalized plan together. In the meantime, we'll address your immediate concerns and provide resources to help you manage all of your medical issues.  We encourage you to use MyChart to review these efforts, and to help [place] find and correct any omissions or errors in your medical chart.  Managing Your Health Over Time  Managing every aspect of your health in a single visit isn't always feasible, but that's okay.  We addressed your most pressing concerns today and charted a course for future care. Acute conditions or preventive care measures may require further attention.  We encourage you to schedule a follow-up visit at your earliest convenience to discuss any unresolved issues.  We strongly encourage participation in annual preventive care visits to help [place] develop a more thorough understanding of your health and to help you maintain optimal wellness -  please inquire about scheduling your next one with [place] at your earliest  convenience.  Your Satisfaction Matters  It was a pleasure seeing you today!  Your health and satisfaction will always be my top priorities. If you believe your experience today was worthy of a 5-star rating, I'd be grateful for your feedback!  Lula Olszewski, MD  Next Steps  Schedule Follow-Up:  We recommend a follow-up appointment in 1 year for your next wellness visit.  If you develop any new problems, want to address any medical issues, or your condition worsens before then, please call us for an appointment or seek emergency care. Preventive Care:  Don't forget to schedule your annual preventive care visit!  Please review your attached preventive care information. Make sure to arrange appointments for dental and vision routine screening, and use nightly nasal saline mist to keep your sinuses clear. Medical Information Release:  For any relevant medical information we don't have, please sign a release form at the front desk so we can obtain it for your records. Lab & X-ray Appointments:  Scheduled any incomplete lab tests today or call us to schedule.  X-Rays can be done without an appointment at [facility] at [facility] ([address], [address]), M-F 8:30am-noon or 1pm-5pm.  Just tell them you're there for X-rays ordered by Dr. Jon Billings.  We'll receive the results and contact you by phone or MyChart to discuss next steps.  Making the Most of Our Focused (20 minute) Appointments:  [x]   Clearly state your top concerns at the beginning of the visit to focus our discussion [x]   If you anticipate you will need more time, please inform the front desk during scheduling - we can book multiple appointments in the same week. [x]   If you have transportation problems- use our convenient video appointments or ask about transportation support. [x]   We can get down to business faster if you use MyChart to update information before the visit and submit non-urgent questions before your visit. Thank  you for taking the time to provide details through MyChart.  Let our nurse know and she can import this information into your encounter documents.  Arrival and Wait Times: [x]   Arriving on time ensures that everyone receives prompt attention. [x]   Early morning (8a) and afternoon (1p) appointments tend to have shortest wait times. [x]   Unfortunately, we cannot delay appointments for late arrivals or hold slots during phone calls.  Thank you for collaborating with [place] to prioritize your health. We look forward to serving you!  Bring to Your Next Appointment  Medications: Please bring all your medication bottles to your next appointment to ensure we have an accurate record of your prescriptions. Health Diaries: If you're monitoring any health conditions at home, keeping a diary of your readings can be very helpful for discussions at your next appointment.  Reviewing Your Records  Please Review this early draft of your clinical notes below and the final encounter summary tomorrow on MyChart after its been completed.   Screening examination for infectious disease -     Hepatitis C antibody -     HIV Antibody (routine testing w rflx)  Vitamin D deficiency -     VITAMIN D 25 Hydroxy (Vit-D Deficiency, Fractures)  Need for Tdap vaccination -     Tdap vaccine greater than or equal to 7yo IM  IgA deficiency (HCC) -     Hepatitis C antibody -     HIV Antibody (routine testing w  rflx) -     VITAMIN D 25 Hydroxy (Vit-D Deficiency, Fractures) -     CBC with Differential/Platelet -     Comprehensive metabolic panel -     Lipid panel -     TSH -     Tdap vaccine greater than or equal to 7yo IM -     Ferritin  Celiac disease -     Hepatitis C antibody -     HIV Antibody (routine testing w rflx) -     VITAMIN D 25 Hydroxy (Vit-D Deficiency, Fractures) -     CBC with Differential/Platelet -     Comprehensive metabolic panel -     Lipid panel -     TSH -     Tdap vaccine greater than or  equal to 7yo IM -     Ferritin  Low serum IgA and IgM levels (HCC) -     Hepatitis C antibody -     HIV Antibody (routine testing w rflx) -     VITAMIN D 25 Hydroxy (Vit-D Deficiency, Fractures) -     CBC with Differential/Platelet -     Comprehensive metabolic panel -     Lipid panel -     TSH -     Tdap vaccine greater than or equal to 7yo IM -     Ferritin  Hyperlipidemia, unspecified hyperlipidemia type  Preventative health care  Encounter for annual general medical examination with abnormal findings in adult  Encounter for annual health examination  Tinnitus of both ears     Getting Answers and Following Up  Simple Questions & Concerns: For quick questions or basic follow-up after your visit, reach [place] at [phone] or MyChart messaging. Complex Concerns: If your concern is more complex, scheduling an appointment might be best. Discuss this with the staff to find the most suitable option. Lab & Imaging Results: We'll contact you directly if results are abnormal or you don't use MyChart. Most normal results will be on MyChart within 2-3 business days, with a review message from Dr. Jon Billings. Haven't heard back in 2 weeks? Need results sooner? Contact us at [phone]. Referrals: Our referral coordinator will manage specialist referrals. The specialist's office should contact you within 2 weeks to schedule an appointment. Call us if you haven't heard from them after 2 weeks.  Staying Connected  MyChart: Activate your MyChart for the fastest way to access results and message [place]. See the last page of this paperwork for instructions on how to activate.  Billing  X-ray & Lab Orders: These are billed by separate companies. Contact the invoicing company directly for questions or concerns. Visit Charges: Discuss any billing inquiries with our administrative services team.  Feedback & Satisfaction  Share Your Experience: We strive for your satisfaction! If you have any  complaints, or preferably compliments, please let Dr. Jon Billings know directly or contact our Practice Administrators, Edwena Felty or [employer], by asking at the front desk.   Scheduling Tips  Shorter Wait Times: 8 am and 1 pm appointments often have the quickest wait times. Longer Appointments: If you need more time during your visit, talk to the front desk. Due to insurance regulations, multiple back-to-back appointments might be necessary.         If cost were no factor for Celiac: Here are the key tests that are typically recommended:  Essential vitamin and nutrient tests: - Vitamin D levels (commonly deficient in celiac disease) - Vitamin B12 (can be  poorly absorbed) - Folate/B9 (often low due to malabsorption) - Iron studies including:   - Ferritin   - Complete iron panel   - Complete blood count (CBC) to check for anemia  Additional important tests: - Zinc levels - Vitamin B6 - Copper levels - Vitamin A - Vitamin E - Vitamin K  Other recommended monitoring: - Comprehensive metabolic panel - Bone density scan (DEXA) due to risk of early osteoporosis - Thyroid function tests (due to increased risk of thyroid disorders) - Celiac antibody levels to monitor disease control  The frequency of testing should be determined by a healthcare provider based on individual circumstances, symptoms, and whether the person is newly diagnosed or has been following a gluten-free diet for some time.

## 2022-12-22 NOTE — Addendum Note (Signed)
Addended by: Lorn Junes on: 12/22/2022 03:15 PM   Modules accepted: Orders

## 2022-12-23 LAB — HEPATITIS C ANTIBODY: Hepatitis C Ab: NONREACTIVE

## 2022-12-23 LAB — HIV ANTIBODY (ROUTINE TESTING W REFLEX): HIV 1&2 Ab, 4th Generation: NONREACTIVE

## 2023-12-23 ENCOUNTER — Encounter: Payer: BC Managed Care – PPO | Admitting: Internal Medicine

## 2023-12-27 ENCOUNTER — Ambulatory Visit: Admitting: Internal Medicine

## 2023-12-27 ENCOUNTER — Encounter: Payer: Self-pay | Admitting: Internal Medicine

## 2023-12-27 VITALS — BP 126/70 | HR 74 | Temp 98.0°F | Ht 66.0 in | Wt 131.8 lb

## 2023-12-27 DIAGNOSIS — Z0001 Encounter for general adult medical examination with abnormal findings: Secondary | ICD-10-CM | POA: Diagnosis not present

## 2023-12-27 DIAGNOSIS — R102 Pelvic and perineal pain unspecified side: Secondary | ICD-10-CM

## 2023-12-27 DIAGNOSIS — H6121 Impacted cerumen, right ear: Secondary | ICD-10-CM | POA: Diagnosis not present

## 2023-12-27 DIAGNOSIS — E559 Vitamin D deficiency, unspecified: Secondary | ICD-10-CM

## 2023-12-27 DIAGNOSIS — F32A Depression, unspecified: Secondary | ICD-10-CM | POA: Diagnosis not present

## 2023-12-27 DIAGNOSIS — R1909 Other intra-abdominal and pelvic swelling, mass and lump: Secondary | ICD-10-CM | POA: Insufficient documentation

## 2023-12-27 DIAGNOSIS — M799 Soft tissue disorder, unspecified: Secondary | ICD-10-CM

## 2023-12-27 DIAGNOSIS — Z23 Encounter for immunization: Secondary | ICD-10-CM

## 2023-12-27 DIAGNOSIS — K900 Celiac disease: Secondary | ICD-10-CM

## 2023-12-27 DIAGNOSIS — D802 Selective deficiency of immunoglobulin A [IgA]: Secondary | ICD-10-CM

## 2023-12-27 DIAGNOSIS — E782 Mixed hyperlipidemia: Secondary | ICD-10-CM | POA: Insufficient documentation

## 2023-12-27 DIAGNOSIS — D751 Secondary polycythemia: Secondary | ICD-10-CM | POA: Insufficient documentation

## 2023-12-27 LAB — COMPREHENSIVE METABOLIC PANEL WITH GFR
ALT: 13 U/L (ref 0–53)
AST: 13 U/L (ref 0–37)
Albumin: 5.4 g/dL — ABNORMAL HIGH (ref 3.5–5.2)
Alkaline Phosphatase: 126 U/L — ABNORMAL HIGH (ref 39–117)
BUN: 10 mg/dL (ref 6–23)
CO2: 27 meq/L (ref 19–32)
Calcium: 9.9 mg/dL (ref 8.4–10.5)
Chloride: 102 meq/L (ref 96–112)
Creatinine, Ser: 0.82 mg/dL (ref 0.40–1.50)
GFR: 124.26 mL/min (ref >60.00)
Glucose, Bld: 99 mg/dL (ref 70–99)
Potassium: 4.1 meq/L (ref 3.5–5.1)
Sodium: 138 meq/L (ref 135–145)
Total Bilirubin: 0.8 mg/dL (ref 0.2–1.2)
Total Protein: 7.2 g/dL (ref 6.0–8.3)

## 2023-12-27 LAB — CBC WITH DIFFERENTIAL/PLATELET
Basophils Absolute: 0.1 K/uL (ref 0.0–0.1)
Basophils Relative: 1.2 % (ref 0.0–3.0)
Eosinophils Absolute: 0.2 K/uL (ref 0.0–0.7)
Eosinophils Relative: 2.7 % (ref 0.0–5.0)
HCT: 49.1 % (ref 39.0–52.0)
Hemoglobin: 16.9 g/dL (ref 13.0–17.0)
Lymphocytes Relative: 24.5 % (ref 12.0–46.0)
Lymphs Abs: 1.5 K/uL (ref 0.7–4.0)
MCHC: 34.4 g/dL (ref 30.0–36.0)
MCV: 84.7 fl (ref 78.0–100.0)
Monocytes Absolute: 0.5 K/uL (ref 0.1–1.0)
Monocytes Relative: 8.8 % (ref 3.0–12.0)
Neutro Abs: 3.8 K/uL (ref 1.4–7.7)
Neutrophils Relative %: 62.8 % (ref 43.0–77.0)
Platelets: 282 K/uL (ref 150.0–400.0)
RBC: 5.79 Mil/uL (ref 4.22–5.81)
RDW: 12.6 % (ref 11.5–15.5)
WBC: 6.1 K/uL (ref 4.0–10.5)

## 2023-12-27 NOTE — Assessment & Plan Note (Signed)
 Pelvic pain and popping, possible musculoskeletal etiology   He experiences pelvic pain and popping, possibly due to a musculoskeletal issue such as muscle injury, stress fracture, or hernia. Popping occurs with certain movements and sneezing. Physical exam did not provide a clear diagnosis. An MRI is recommended for further evaluation due to its comprehensive nature compared to ultrasound. He will be referred to sports medicine for further evaluation.  Popping of multiple joints   He has popping in multiple joints, possibly related to a genetic issue affecting tendons and muscles. There is no pain associated with the popping, but there is concern for potential future arthritis. A referral to sports medicine for evaluation of joint popping is advised.

## 2023-12-27 NOTE — Assessment & Plan Note (Signed)
 He experiences mild depression with feelings of loneliness. Non-pharmacological interventions, including counseling, social engagement, and lifestyle modifications, were discussed. He is interested in counseling but concerned about cost so defers for now. The role of social connections and hobbies in alleviating loneliness was emphasized. A counseling referral will be provided, and he is encouraged to engage socially through hobbies and clubs, as well as to consider lifestyle modifications for mood improvement.

## 2023-12-27 NOTE — Assessment & Plan Note (Signed)
 Celiac disease   His celiac disease is managed with a gluten-free diet. Discussed potential gluten-free bread options, such as Ezekiel bread, though it may not be suitable due to potential gluten content. He should continue the gluten-free diet and consider trying gluten-free bread options.

## 2023-12-27 NOTE — Progress Notes (Signed)
 [facility] at [facility] [address] [place], [place] [address] Office:  [phone]  -- Annual Preventive Medical Office Visit --  Patient:  Bradley Esparza      Age: 23 y.o.       Sex:  male  Date:   12/27/2023 Patient Care Team: Jesus Bernardino MATSU, MD as PCP - General (Internal Medicine) Luke Orlan HERO, DO as Consulting Physician (Allergy) Today's Healthcare Provider: Bernardino MATSU Jesus, MD  ========================================= Chief complaint: Annual Exam (Pt is present for cpe pt is fasting for blood work ), Pelvic Pain (Pt states has been having pelvic pain for few months happens after running each time.), and Depression  Purpose of Visit: Comprehensive preventive health assessment and personalized health maintenance planning.  This encounter was conducted as a Comprehensive Physical Exam (CPE) preventive care annual visit. The patient's medical history and problem list were reviewed to inform individualized preventive care recommendations.   No problem-specific medical treatment was provided during this visit.  Assessment & Plan Encounter for annual general medical examination with abnormal findings in adult Adult Wellness Visit   During the routine adult wellness visit, general health maintenance was discussed, including vaccinations and dietary habits. The importance of avoiding trans fats and maintaining a healthy diet was emphasized. The benefits of a fixed sleep schedule for improved sleep quality and overall health were also discussed. He will receive the HPV vaccine series and is encouraged to maintain a fixed sleep schedule and avoid trans fats and junk food. Need for HPV vaccination After discussion of medical recommendations/risks/benefits, vaccine was given  Depressive disorder He experiences mild depression with feelings of loneliness. Non-pharmacological interventions, including counseling, social engagement, and lifestyle modifications, were  discussed. He is interested in counseling but concerned about cost so defers for now. The role of social connections and hobbies in alleviating loneliness was emphasized. A counseling referral will be provided, and he is encouraged to engage socially through hobbies and clubs, as well as to consider lifestyle modifications for mood improvement. Musculoskeletal disorder Pelvic pain Bulge in groin area Pelvic pain and popping, possible musculoskeletal etiology   He experiences pelvic pain and popping, possibly due to a musculoskeletal issue such as muscle injury, stress fracture, or hernia. Popping occurs with certain movements and sneezing. Physical exam did not provide a clear diagnosis. An MRI is recommended for further evaluation due to its comprehensive nature compared to ultrasound. He will be referred to sports medicine for further evaluation.  Popping of multiple joints   He has popping in multiple joints, possibly related to a genetic issue affecting tendons and muscles. There is no pain associated with the popping, but there is concern for potential future arthritis. A referral to sports medicine for evaluation of joint popping is advised. IgA deficiency (HCC) Immunoglobulin A (IgA) deficiency   IgA deficiency contributes to immunosuppression, increasing the risk of infections, including COVID-19. Despite the recommendation, he declined COVID-19 vaccination due to concerns about immunosuppression and asthma. The potential benefits of vaccination were discussed. Celiac disease Celiac disease   His celiac disease is managed with a gluten-free diet. Discussed potential gluten-free bread options, such as Ezekiel bread, though it may not be suitable due to potential gluten content. He should continue the gluten-free diet and consider trying gluten-free bread options. Hearing loss due to cerumen impaction, right He has cerumen impaction in the right ear causing blockage, which may impact hearing  and balance. Ear irrigation by a nurse is offered, and over-the-counter earwax removal  products are recommended. Vitamin D  deficiency  Hyperlipidemia, mixed  Polycythemia     ICD-10-CM   1. Encounter for annual general medical examination with abnormal findings in adult  Z00.01 CBC with Differential/Platelet    Comp Met (CMET)    TSH Rfx on Abnormal to Free T4    2. Need for HPV vaccination  Z23 HPV 9-valent vaccine,Recombinat    3. Depressive disorder  F32.A     4. Bulge in groin area  R19.09     5. Pelvic pain  R10.20      Reviewed/updated/encouraged completion: Immunization History  Administered Date(s) Administered   DTaP 03/10/2001, 05/10/2001, 06/28/2001, 04/05/2002   Dtap, Unspecified 01/28/2005   HIB, Unspecified 03/10/2001, 05/10/2001, 06/28/2001, 04/05/2002   Hep A, Unspecified 01/14/2006, 12/30/2006   Hep B, Unspecified Sep 28, 2000, 02/10/2001, 09/27/2001   IPV 03/10/2001, 05/10/2001, 09/27/2001   Influenza, Seasonal, Injecte, Preservative Fre 01/09/2009, 01/09/2010, 01/12/2011   Influenza,inj,Quad PF,6+ Mos 01/19/2012, 01/18/2013, 11/23/2018   Influenza-Unspecified 01/10/2002   MMR 01/10/2002, 01/28/2005   Meningococcal B, OMV 11/05/2017, 11/23/2018   Meningococcal Conjugate 01/19/2012, 11/05/2017   PNEUMOCOCCAL CONJUGATE-20 08/21/2020   Pneumococcal Conjugate PCV 7 03/10/2001, 05/10/2001, 06/28/2001, 01/10/2002   Polio, Unspecified 01/28/2005   Tdap 01/19/2012, 12/22/2022   Varicella 01/10/2002, 01/28/2005   Health Maintenance Due  Topic Date Due   HPV VACCINES (1 - Male 3-dose series) Never done   Influenza Vaccine  10/01/2023   Health Maintenance  Topic Date Due   HPV VACCINES (1 - Male 3-dose series) Never done   Influenza Vaccine  10/01/2023   COVID-19 Vaccine (1) 01/12/2024 (Originally 12/28/2005)   DTaP/Tdap/Td (8 - Td or Tdap) 12/21/2032   Pneumococcal Vaccine  Completed   Hepatitis C Screening  Completed   HIV Screening  Completed    Meningococcal B Vaccine  Completed    Reviewed the following verbally with patient and provided AVS materials:  HEALTH MAINTENANCE COUNSELING AND ANTICIPATORY GUIDANCE    Preventive Measure Recommendation  Eye Exams Every 1-2 years;   Dental Care Cleanings every 6 months or more, brush/floss 3x daily not keeping up but he makes solemn promise to schedule.   Sinus Care Saline spray rinses daily  Sleep 8 hours nightly, good sleep hygiene, e-monitoring if any daytime drowsiness  Diet Fruits/vegetables/fiber/healthy fats, balance and moderation  Exercise 150 minutes weekly  Risk Behaviors Discouraged any/all high risk behaviors   CANCER SCREENING SHARED DECISION MAKING    Penile/Testicle/Scrotum Encouraged self-monitoring and reporting of genital abnormalities. Patient reports none.  Thyroid Thyroid was palpated for nodules today.none  Prostate Individualized risks/benefits/costs discussed Patient is <45-50 and average risk; routine screening not indicated today. Education provided about future SDM timeline. No results found for: PSA  Colon HM Colonoscopy   This patient has no relevant Health Maintenance data.   Patient is <45 without additional risk factors (reports no bleeding or early family history or inflammatory bowel disease); no routine screening indicated today. Counseling provided on symptoms that warrant earlier evaluation. (Education given.)  Lung Current guidelines recommend individuals aged 31 to 12 who currently smoke or formerly smoked and have a >= 20 pack-year smoking history should undergo annual screening with low-dose computed tomography (LDCT). Tobacco Use: Low Risk  (12/27/2023)   Patient History    Smoking Tobacco Use: Never    Smokeless Tobacco Use: Never    Passive Exposure: Not on file   Social History   Tobacco Use  Smoking Status Never  Smokeless Tobacco Never    Skin Advised regular sunscreen  use. Patient denies worrisome, changing, or new skin  lesions. Offered to include images in chart for surveillance. Showed patient these pictures of melanomas for reference to educate for self-monitoring.    Discussed the use of AI scribe software for clinical note transcription with the patient, who gave verbal consent to proceed.  History of Present Illness  23 year old male who presents for an annual physical exam.  He adheres to a gluten-free diet due to celiac disease. He maintains a generally healthy diet. He has IgA deficiency but does not experience frequent illnesses. No current asthma symptoms are present, and he has not used an albuterol inhaler in a long time.  He experiences variable sleep patterns, sometimes getting eight to nine hours of sleep and other times only four to five hours, attributed to an inconsistent work schedule at [employer].  He reports popping sensations in the pelvis, particularly above the perineum, occurring with certain movements and sneezing, and pain in this area after running. No associated urinary or bowel symptoms are present. He also experiences popping in other joints, which is not painful.  He has a history of a rash, though it is unclear if it is still present.  He denies any family history of cancer and has not experienced consistent blood in his stool, though he recalls a single episode about a year ago.  He reports mild depression and feelings of loneliness, attributed to a lack of social connections. He lives with his mother and has tried dating apps without success.   ROS A comprehensive ROS was negative for any concerning symptoms.  Had a little bright red blood per rectum 1 year(s) ago.   Completed medication reconciliation: Current Outpatient Medications on File Prior to Visit  Medication Sig   hydrocortisone  (ANUSOL -HC) 2.5 % rectal cream Place 1 Application rectally 2 (two) times daily as needed for hemorrhoids or anal itching. (Patient taking differently: Place 1 Application  rectally 2 (two) times daily as needed for hemorrhoids or anal itching (As needed).)   VITAMIN D  PO Take 125 mg by mouth daily. (Patient not taking: Reported on 12/27/2023)   No current facility-administered medications on file prior to visit.  There are no discontinued medications.The following were reviewed and/or entered/updated into our electronic MEDICAL RECORD NUMBERPast Medical History:  Diagnosis Date   Allergic rhinitis    Angio-edema    Anxiety    Asthma    Celiac disease 05/23/2019   Depression    Eczema    GERD (gastroesophageal reflux disease)    Hematuria    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   Urticaria    Vitamin D  deficiency 07/08/2020   Past Surgical History:  Procedure Laterality Date   COLONOSCOPY  05/2019   CYSTECTOMY  2006   eyebrow   ESOPHAGOGASTRODUODENOSCOPY  05/2019   TYMPANOSTOMY TUBE PLACEMENT     Urethoplasty     Social History   Socioeconomic History   Marital status: Single    Spouse name: Not on file   Number of children: Not on file   Years of education: Not on file   Highest education level: Not on file  Occupational History   Not on file  Tobacco Use   Smoking status: Never   Smokeless tobacco: Never  Vaping Use   Vaping status: Never Used  Substance and Sexual Activity   Alcohol use: Never   Drug use: Never   Sexual activity: Not Currently  Other Topics Concern   Not on file  Social  History Narrative   2021 graduate [employer].     Attending college   never smoker no school problems lives with mom and younger sister   Social Drivers of Corporate Investment Banker Strain: Not on file  Food Insecurity: Not on file  Transportation Needs: Not on file  Physical Activity: Not on file  Stress: Not on file  Social Connections: Not on file  Intimate Partner Violence: Not on file   Family History  Problem Relation Age of Onset   Depression Mother    Asthma Mother    Allergic rhinitis Mother    Eczema Mother    Urticaria  Mother    GER disease Father    Asthma Father    Allergic rhinitis Father    Eczema Father    Asthma Sister    Allergic rhinitis Sister    Eczema Sister    Hypertension Maternal Grandmother    Depression Maternal Grandmother    GER disease Maternal Grandmother    Irritable bowel syndrome Maternal Grandmother    Asthma Paternal Grandfather    Stroke Paternal Grandfather    Irritable bowel syndrome Maternal Great-grandmother    Colon cancer Neg Hx    Esophageal cancer Neg Hx    Stomach cancer Neg Hx    Rectal cancer Neg Hx    Allergies  Allergen Reactions   Augmentin [Amoxicillin-Pot Clavulanate] Other (See Comments)    urticaria   Gluten Meal Other (See Comments)    GI intolerance (upset stomach)   Penicillins Hives and Swelling   Social History   Substance and Sexual Activity  Sexual Activity Not Currently  @    12/27/2023   11:05 AM  Depression screen PHQ 2/9  Decreased Interest 1  Down, Depressed, Hopeless 1  PHQ - 2 Score 2  Altered sleeping 0  Tired, decreased energy 1  Change in appetite 1  Feeling bad or failure about yourself  1  Trouble concentrating 0  Moving slowly or fidgety/restless 0  Suicidal thoughts 0  PHQ-9 Score 5  Difficult doing work/chores Somewhat difficult      12/22/2022   10:33 AM  Fall Risk   Falls in the past year? 1  Number falls in past yr: 0  Injury with Fall? 1  Risk for fall due to : No Fall Risks  Follow up Falls evaluation completed     BP 126/70   Pulse 74   Temp 98 F (36.7 C) (Temporal)   Ht 5' 6 (1.676 m)   Wt 131 lb 12.8 oz (59.8 kg)   SpO2 98%   BMI 21.27 kg/m  BP Readings from Last 3 Encounters:  12/27/23 126/70  12/22/22 136/64  04/03/22 138/76   Wt Readings from Last 10 Encounters:  12/27/23 131 lb 12.8 oz (59.8 kg)  12/22/22 135 lb (61.2 kg)  04/03/22 137 lb (62.1 kg)  08/11/21 130 lb 6 oz (59.1 kg)  05/13/21 128 lb (58.1 kg)  03/31/21 128 lb 8 oz (58.3 kg)  11/11/20 122 lb 12.8 oz (55.7 kg)  (5%, Z= -1.63)*  08/07/20 122 lb 12.8 oz (55.7 kg) (5%, Z= -1.61)*  07/04/20 124 lb (56.2 kg) (6%, Z= -1.52)*  11/28/19 119 lb (54 kg) (4%, Z= -1.74)*   * Growth percentiles are based on CDC (Boys, 2-20 Years) data.  Physical Exam  Physical Exam HEENT: Cerumen impaction in right ear. Possible impaction on right lower molar. NECK: Small lymph nodes palpable in cervical region, slightly larger on the right.  MUSCULOSKELETAL: Possible inguinal hernia in groin area. Popping sensation in pelvis.  GEN: No acute distress, resting comfortably. HEENT: Tympanic membranes normal appearing bilaterally, oropharynx clear, no thyromegaly noted, no palpable lymphadenopathy or thyroid nodules. CARDIOVASCULAR: S1 and S2 heart sounds with regular rate and rhythm, no murmurs appreciated. PULMONARY: Normal work of breathing, clear to auscultation bilaterally, no crackles, wheezes, or rhonchi. ABDOMEN: Soft, nontender, nondistended. MSK: No edema, cyanosis, or clubbing noted. SKIN: Warm, dry, no lesions of concern observed. NEUROLOGICAL: Cranial nerves II-XII grossly intact, strength 5/5 in upper and lower extremities, reflexes symmetric and intact bilaterally. PSYCH: Normal affect and thought content, pleasant and cooperative.   Photographs Taken 12/27/2023 :        Latest Ref Rng 05/08/2019 07/04/2020 07/18/2020 09/27/2020 12/27/2020 03/14/2021  CBC         WBC 4.0 - 10.5 K/uL  6.8  6.8      Hemoglobin 13.0 - 17.0 g/dL  83.6 (H)  82.7      Hematocrit 39.0 - 52.0 %  46.4  49.5      Platelets 150.0 - 400.0 K/uL  291.0  313      IRON STUDIES         Serum Iron 42 - 165 ug/dL        Transferrin 787.9 - 360.0 mg/dL        TIBC 749.9 - 549.9 mcg/dL        Serum Ferritin 77.9 - 322.0 ng/mL  43.5       CMP         Glucose 70 - 99 mg/dL  98       BUN 6 - 23 mg/dL  15       Creatinine 9.59 - 1.50 mg/dL  9.16       Sodium 864 - 145 mEq/L  139       Potassium 3.5 - 5.1 mEq/L  3.8       Chloride 96 - 112 mEq/L   103       CO2 19 - 32 mEq/L  25       Calcium 8.4 - 10.5 mg/dL  89.5       Total Protein 6.0 - 8.3 g/dL 7.1  7.6       Total Bilirubin 0.2 - 1.2 mg/dL 0.8  0.9       Alkaline Phos 39 - 117 U/L 201 (H)  102       AST 0 - 37 U/L 12  11       ALT 0 - 53 U/L 10  12       VITAMINS & MINERALS         Vitamin D -25OH 30.00 - 100.00 ng/mL  12.37 (L)   25.85 (L)  32.57  33.14   Vitamin B12 211 - 911 pg/mL  484       Serum Iron 42 - 165 ug/dL        Serum Ferritin 77.9 - 322.0 ng/mL  43.5       DIABETES LABS         Serum glucose 70 - 99 mg/dL  98       THYROID         TSH 0.35 - 5.50 uIU/mL        LIPIDS         Total cholesterol 0 - 200 mg/dL        HDL cholesterol >39.00 mg/dL        LDL (calc) 0 -  99 mg/dL        T.Chol/HDL Ratio         VLDL cholesterol 0.0 - 40.0 mg/dL        Triglycerides 0.0 - 149.0 mg/dL        SEROLOGY         Hep C Ab NON-REACTIVE         HIV Ab NON-REACTIVE           Latest Ref Rng 12/22/2022 12/27/2023  CBC     WBC 4.0 - 10.5 K/uL 7.2  6.1   Hemoglobin 13.0 - 17.0 g/dL 83.7  83.0   Hematocrit 39.0 - 52.0 % 48.1  49.1   Platelets 150.0 - 400.0 K/uL 294.0  282.0   IRON STUDIES     Serum Iron 42 - 165 ug/dL 827 (H)    Transferrin 212.0 - 360.0 mg/dL 729.9    TIBC 749.9 - 549.9 mcg/dL 621.9    Serum Ferritin 22.0 - 322.0 ng/mL 77.5    Serum Ferritin  75.7    CMP     Glucose 70 - 99 mg/dL 99  99   BUN 6 - 23 mg/dL 12  10   Creatinine 9.59 - 1.50 mg/dL 9.19  9.17   Sodium 864 - 145 mEq/L 139  138   Potassium 3.5 - 5.1 mEq/L 4.1  4.1   Chloride 96 - 112 mEq/L 104  102   CO2 19 - 32 mEq/L 24  27   Calcium 8.4 - 10.5 mg/dL 89.7  9.9   Total Protein 6.0 - 8.3 g/dL 7.4  7.2   Total Bilirubin 0.2 - 1.2 mg/dL 1.0  0.8   Alkaline Phos 39 - 117 U/L 115  126 (H)   AST 0 - 37 U/L 25  13   ALT 0 - 53 U/L 32  13   VITAMINS & MINERALS     Vitamin D -25OH 30.00 - 100.00 ng/mL 56.94    Vitamin B12 211 - 911 pg/mL    Serum Iron 42 - 165 ug/dL 827 (H)    Serum  Ferritin 22.0 - 322.0 ng/mL 77.5    Serum Ferritin  75.7    DIABETES LABS     Serum glucose 70 - 99 mg/dL 99  99   THYROID     TSH 0.35 - 5.50 uIU/mL 1.26    LIPIDS     Total cholesterol 0 - 200 mg/dL 800    HDL cholesterol >39.00 mg/dL 49.79    LDL (calc) 0 - 99 mg/dL 881 (H)    T.Chol/HDL Ratio  4    VLDL cholesterol 0.0 - 40.0 mg/dL 68.7    Triglycerides 0.0 - 149.0 mg/dL 843.9 (H)    SEROLOGY     Hep C Ab NON-REACTIVE  NON-REACTIVE    HIV Ab NON-REACTIVE  NON-REACTIVE      Legend: (H) High (L) Low   ======================================  IMPORTANT HEALTH REMINDERS: Report any new or changing skin lesions promptly Maintain recommended screening schedules Discuss any new family history of cancer at future visits Follow up on any new symptoms that persist more than two weeks      Notes:  This document was synthesized by artificial intelligence (Abridge) using HIPAA-compliant recording of the clinical interaction;   We discussed the use of AI scribe software for clinical note transcription with the patient, who gave verbal consent to proceed.    This encounter employed state-of-the-art, real-time, collaborative documentation. The patient was empowered to actively review and  assist in updating their electronic medical record on a shared monitor, ensuring transparency and improving accuracy.    Prior to and at the beginning of Comprehensive Physical Exam (CPE) preventive care annual visit appointment types  we clarify to patients Our goal today is to focus on your preventive or annual Comprehensive Physical Exam (CPE) preventive care annual visit, which typically covers routine screenings and overall health maintenance. However, if you share any new or concerning symptoms--such as dizziness, passing out, severe pain, or anything else that may point to a more serious issue--we are both legally and ethically required to evaluate it. We cannot simply overlook or ignore such  concerns, even if you later decide you don't want to discuss them, because it could jeopardize your health.  If addressing a new concern takes us  beyond the scope of the preventive visit, we may need to bill separately for that portion of care. We understand financial considerations are important, and we're happy to discuss your options if something new comes up. However, we want to be clear that once you mention a potentially serious issue, we must investigate it; we can't ethically or legally exclude that from our records or our evaluation. Please let us  know all of your questions or worries. Together, we can decide how best to manage them and how to minimize any unexpected costs, but we want to keep you safe above all else.   This disclosure is mandated by professional ethics and legal obligations, as healthcare providers must address any substantial health concerns raised during any patient interaction and a comprehensive ROS is required by insurance companies for billing preventive-care visit type.   This disclosure ultimately discourages patients financially from reporting significant health issues.   Medical Screening Exam A medical screening exam (MSE) helps to determine whether you need immediate medical treatment relating to any number of symptoms you are having. This type of exam may be done in an emergency department, an urgent care setting, or your health care provider's office. Depending on your symptoms and severity, you may need additional tests or medical therapy. It is important to note that an MSE does not necessarily mean that you will need or receive further medical testing or interventions if your symptoms are not deemed to be medically urgent (emergent). Tell a health care provider about: Any allergies you have. All medicines you are taking, including vitamins, herbs, eye drops, creams, and over-the-counter medicines. Any problems you or family members have had with anesthetic  medicines. Any bleeding problems you have. Any surgeries you have had. Any medical conditions you have. Whether you are pregnant or may be pregnant. What happens during the test? During the exam, a health care provider does a short, often focused, physical exam and asks about your medical history to assess: Your current symptoms. Your overall health. Your need for possible further medical intervention. What can I expect after the test? If you have a regular health care provider, make an appointment for a follow-up visit with him or her. If you do not have a regular health care provider, ask about resources in your community. Your medical screening exam may determine that: You do not need emergency treatment at this time. You need treatment right away. You need to be transferred to another medical center. This may happen if you need an emergent specialist or consultant that is not available at the medical center you are at. You need to have more tests. A medical specialist may be consulted if needed. Get help  right away if: Your condition gets worse. You develop new or troubling symptoms before you see your health care provider. These symptoms may represent a serious problem that is an emergency. Do not wait to see if the symptoms will go away. Get medical help right away. Call your local emergency services (911 in the [place] Do not drive yourself to the hospital. Summary A medical screening exam helps to determine whether you need medical treatment right away. This type of exam may be done in an emergency department, an urgent care setting, or your health care provider's office. During the exam, a health care provider does a short physical exam and asks about your current symptoms and overall health. Depending on the exam, more tests or therapies may be ordered. However, an MSE does not necessarily mean that you will have further medical testing if your symptoms are not deemed to be  urgent. If you need further care that is not offered at your current medical center, you may need to be transferred to another facility. This information is not intended to replace advice given to you by your health care provider. Make sure you discuss any questions you have with your health care provider. Document Revised: 10/30/2020 Document Reviewed: 06/27/2020 Elsevier Patient Education  2024 [employer]   Health Maintenance, Male Adopting a healthy lifestyle and getting preventive care are important in promoting health and wellness. Ask your health care provider about: The right schedule for you to have regular tests and exams. Things you can do on your own to prevent diseases and keep yourself healthy. What should I know about diet, weight, and exercise? Eat a healthy diet  Eat a diet that includes plenty of vegetables, fruits, low-fat dairy products, and lean protein. Do not eat a lot of foods that are high in solid fats, added sugars, or sodium. Maintain a healthy weight Body mass index (BMI) is a measurement that can be used to identify possible weight problems. It estimates body fat based on height and weight. Your health care provider can help determine your BMI and help you achieve or maintain a healthy weight. Get regular exercise Get regular exercise. This is one of the most important things you can do for your health. Most adults should: Exercise for at least 150 minutes each week. The exercise should increase your heart rate and make you sweat (moderate-intensity exercise). Do strengthening exercises at least twice a week. This is in addition to the moderate-intensity exercise. Spend less time sitting. Even light physical activity can be beneficial. Watch cholesterol and blood lipids Have your blood tested for lipids and cholesterol at 23 years of age, then have this test every 5 years. You may need to have your cholesterol levels checked more often if: Your lipid or  cholesterol levels are high. You are older than 23 years of age. You are at high risk for heart disease. What should I know about cancer screening? Many types of cancers can be detected early and may often be prevented. Depending on your health history and family history, you may need to have cancer screening at various ages. This may include screening for: Colorectal cancer. Prostate cancer. Skin cancer. Lung cancer. What should I know about heart disease, diabetes, and high blood pressure? Blood pressure and heart disease High blood pressure causes heart disease and increases the risk of stroke. This is more likely to develop in people who have high blood pressure readings or are overweight. Talk with your health care provider about  your target blood pressure readings. Have your blood pressure checked: Every 3-5 years if you are 77-34 years of age. Every year if you are 33 years old or older. If you are between the ages of 94 and 42 and are a current or former smoker, ask your health care provider if you should have a one-time screening for abdominal aortic aneurysm (AAA). Diabetes Have regular diabetes screenings. This checks your fasting blood sugar level. Have the screening done: Once every three years after age 42 if you are at a normal weight and have a low risk for diabetes. More often and at a younger age if you are overweight or have a high risk for diabetes. What should I know about preventing infection? Hepatitis B If you have a higher risk for hepatitis B, you should be screened for this virus. Talk with your health care provider to find out if you are at risk for hepatitis B infection. Hepatitis C Blood testing is recommended for: Everyone born from [birth year] through 1965. Anyone with known risk factors for hepatitis C. Sexually transmitted infections (STIs) You should be screened each year for STIs, including gonorrhea and chlamydia, if: You are sexually active and are younger  than 23 years of age. You are older than 23 years of age and your health care provider tells you that you are at risk for this type of infection. Your sexual activity has changed since you were last screened, and you are at increased risk for chlamydia or gonorrhea. Ask your health care provider if you are at risk. Ask your health care provider about whether you are at high risk for HIV. Your health care provider may recommend a prescription medicine to help prevent HIV infection. If you choose to take medicine to prevent HIV, you should first get tested for HIV. You should then be tested every 3 months for as long as you are taking the medicine. Follow these instructions at home: Alcohol use Do not drink alcohol if your health care provider tells you not to drink. If you drink alcohol: Limit how much you have to 0-2 drinks a day. Know how much alcohol is in your drink. In the [place] one drink equals one 12 oz bottle of beer (355 mL), one 5 oz glass of wine (148 mL), or one 1 oz glass of hard liquor (44 mL). Lifestyle Do not use any products that contain nicotine or tobacco. These products include cigarettes, chewing tobacco, and vaping devices, such as e-cigarettes. If you need help quitting, ask your health care provider. Do not use street drugs. Do not share needles. Ask your health care provider for help if you need support or information about quitting drugs. General instructions Schedule regular health, dental, and eye exams. Stay current with your vaccines. Tell your health care provider if: You often feel depressed. You have ever been abused or do not feel safe at home. Summary Adopting a healthy lifestyle and getting preventive care are important in promoting health and wellness. Follow your health care provider's instructions about healthy diet, exercising, and getting tested or screened for diseases. Follow your health care provider's instructions on monitoring your cholesterol and  blood pressure. This information is not intended to replace advice given to you by your health care provider. Make sure you discuss any questions you have with your health care provider. Document Revised: 07/08/2020 Document Reviewed: 07/08/2020 [employer]vier Patient Education  2024 [employer].

## 2023-12-27 NOTE — Patient Instructions (Addendum)
 VISIT SUMMARY: Today, you came in for your annual physical exam. We discussed your current health status, including your gluten-free diet for celiac disease, variable sleep patterns, and mild depression. We also addressed your concerns about pelvic pain and popping sensations, joint popping, and right ear cerumen impaction. General health maintenance, including vaccinations and dietary habits, was also reviewed.  YOUR PLAN: -PELVIC PAIN AND POPPING: You have been experiencing pelvic pain and popping sensations, which may be due to a muscle injury, stress fracture, or hernia. An MRI is recommended for a more detailed evaluation, and you will be referred to sports medicine for further assessment.  -POPPING OF MULTIPLE JOINTS: You have popping in multiple joints, which could be related to a genetic issue affecting your tendons and muscles. Although it is not currently painful, it may lead to arthritis in the future. A referral to sports medicine is advised for further evaluation.  -CELIAC DISEASE: Your celiac disease is managed with a gluten-free diet. We discussed potential gluten-free bread options, but you should continue to avoid gluten and consider trying different gluten-free bread products.  -IMMUNOGLOBULIN A (IGA) DEFICIENCY: IgA deficiency can increase your risk of infections. Despite your concerns, the benefits of the COVID-19 vaccination were discussed, but you declined the vaccine at this time.  -DEPRESSION, MILD: You are experiencing mild depression and feelings of loneliness. We discussed non-medication options like counseling, social engagement, and lifestyle changes. A referral for counseling will be provided, and you are encouraged to engage in social activities and hobbies.  -RIGHT EAR CERUMEN IMPACTION: You have earwax buildup in your right ear, which can affect your hearing and balance. Ear irrigation by a nurse is offered, and you can also use over-the-counter earwax removal  products.  -ADULT WELLNESS VISIT: During your wellness visit, we discussed general health maintenance, including the importance of vaccinations, a healthy diet, and a fixed sleep schedule. You will receive the HPV vaccine series, and you are encouraged to avoid trans fats and junk food.  INSTRUCTIONS: Please schedule an MRI for your pelvic pain and follow up with sports medicine for both your pelvic pain and joint popping. A referral for counseling will be provided to help with your mild depression. Additionally, you should receive the HPV vaccine series. If you choose to, you can have ear irrigation done by a nurse or use over-the-counter earwax removal products for your right ear cerumen impaction.  Building Your Long-Term Health Plan  During today's preventive visit, we covered a variety of important health checks to help you stay on top of your well-being.  We also discussed strategies to maintain your health and identified some areas that might benefit from further exploration.   Preventive care visits like today's are designed to be proactive, but sometimes additional attention may be needed.  Rest assured, we're here for you.  If these areas require further evaluation or management, we'd be happy to schedule a separate, focused appointment to address them in detail.  Addressing Next Steps  [x]   Follow-up Visit: To ensure we address any unresolved issues and continue monitoring your overall health, we recommend scheduling a follow-up appointment in 1 year for your next preventive care visit. If you experience any new problems, need to discuss any medical concerns, or your condition worsens before then, please don't hesitate to call our office to schedule an appointment or seek emergency care as needed.  [x]   Preventive Measures: Maintaining healthy habits plays a crucial role in overall wellness. We recommend considering these tips: [x]   Regular appointments with dental and vision  professionals [x]   Nightly nasal saline mist to keep sinuses clear [x]   Consistent toothbrushing to maintain oral health [x]   Using an app like SnoreLab to track sleep quality [x]   Routine checks of blood pressure and heart rate [x]   Medical Information: In some instances, we may require additional medical information from other providers to create a comprehensive picture of your health. If applicable, we can provide a medical information release form at the front desk for you to sign, allowing us  to gather these records. [x]   Lab Tests: If any lab tests were ordered today, scheduling them within a week of your visit helps ensure the best possible insurance coverage.  Planning Follow Up to Work on a Problem? Make the Most of Our Focused (20 minute) Appointments  [x]   Clearly state your top concerns at the beginning of the visit to focus our discussion [x]   If you anticipate you will need more time, please inform the front desk during scheduling - we can book multiple appointments in the same week. [x]   If you have transportation problems- use our convenient video appointments or ask about transportation support. [x]   We can get down to business faster if you use MyChart to update information before the visit and submit non-urgent questions before your visit. Thank you for taking the time to provide details through MyChart.  Let our nurse know and she can import this information into your encounter documents.  Arrival and Wait Times  [x]   Arriving on time ensures that everyone receives prompt attention. [x]   Early morning (8a) and afternoon (1p) appointments tend to have shortest wait times. [x]   Unfortunately, we cannot delay appointments for late arrivals or hold slots during phone calls.  Bring to Your Next Appointment:  [x]   Medications: Please bring all your medication bottles to your next appointment to ensure we have an accurate record of your prescriptions. [x]   Health Diaries: If  you're monitoring any health conditions at home, keeping a diary of your readings can be very helpful for discussions at your next appointment.  Reviewing Your Records  [x]   Review your attached preventive care information at the end of these patient instructions. [x]   Review this early draft of your clinical encounter notes below and the final encounter summary tomorrow on MyChart after its been completed.      Getting Answers and Following Up  [x]   Simple Questions & Concerns: For quick questions or basic follow-up after your visit, reach us  at [phone] or MyChart messaging. [x]   Complex Concerns: If your concern is more complex, scheduling an appointment might be best. Discuss this with the staff to find the most suitable option. [x]   Lab & Imaging Results: We'll contact you directly if results are abnormal or you don't use MyChart. Most normal results will be on MyChart within 2-3 business days, with a review message from Dr. Jesus. Haven't heard back in 2 weeks? Need results sooner? Contact us  at [phone]. [x]   Referrals: Our referral coordinator will manage specialist referrals. The specialist's office should contact you within 2 weeks to schedule an appointment. Call us  if you haven't heard from them after 2 weeks.  Staying Connected  [x]   MyChart: Activate your MyChart for the fastest way to access results and message us . See the last page of this paperwork for instructions on how to activate.  Billing  [x]   X-ray & Lab Orders: These are billed by separate companies. Contact the  invoicing company directly for questions or concerns. [x]   Visit Charges: Discuss any billing inquiries with our administrative services team.  Your Satisfaction Matters  [x]   Share Your Experience: We strive for your satisfaction! If you have any complaints, or preferably compliments, please let Dr. Jesus know directly or contact our Practice Administrators, Manuelita Rubin or [employer], by asking at the front desk.                 Next Steps  [x]   Schedule Follow-Up:  We recommend a follow-up appointment in 1 year for your next wellness visit.  If you develop any new problems, want to address any medical issues, or your condition worsens before then, please call us  for an appointment or seek emergency care. [x]   Preventive Care:  Make sure to keep regular appointments with dental and vision professionals, use nightly nasal saline mist sprays to keep your sinuses clear and toothbrushing to protect your teeth. Use SnoreLab App or other app to track your sleep quality. Check blood pressure and heart rate routinely. [x]   Medical Information Release:  For any relevant medical information we don't have, please sign a release form at the front desk so we can obtain it for your records. [x]   Lab Tests:  Schedule any lab tests from today for within a week to ensure best insurance coverage.    Making the Most of Our Focused (20 minute) Appointments:  [x]   Clearly state your top concerns at the beginning of the visit to focus our discussion [x]   If you anticipate you will need more time, please inform the front desk during scheduling - we can book multiple appointments in the same week. [x]   If you have transportation problems- use our convenient video appointments or ask about transportation support. [x]   We can get down to business faster if you use MyChart to update information before the visit and submit non-urgent questions before your visit. Thank you for taking the time to provide details through MyChart.  Let our nurse know and she can import this information into your encounter documents.  Arrival and Wait Times: [x]   Arriving on time ensures that everyone receives prompt attention. [x]   Early morning (8a) and afternoon (1p) appointments tend to have shortest wait times. [x]   Unfortunately, we cannot delay appointments for late arrivals or hold slots during  phone calls.  Bring to Your Next Appointment  [x]   Medications: Please bring all your medication bottles to your next appointment to ensure we have an accurate record of your prescriptions. [x]   Health Diaries: If you're monitoring any health conditions at home, keeping a diary of your readings can be very helpful for discussions at your next appointment.  Reviewing Your Records  [x]   Review your attached preventive care information at the end of these patient instructions. [x]   Review this early draft of your clinical encounter notes below and the final encounter summary tomorrow on MyChart after its been completed.   Encounter for annual general medical examination with abnormal findings in adult -     CBC with Differential/Platelet -     Comprehensive metabolic panel with GFR -     TSH Rfx on Abnormal to Free T4  Need for HPV vaccination -     HPV 9-valent vaccine,Recombinat  Depressive disorder -     Ambulatory referral to Psychology  Bulge in groin area -     Ambulatory referral to Sports Medicine -     MR PELVIS  W WO CONTRAST; Future -     Urinalysis w microscopic + reflex cultur  Pelvic pain -     Ambulatory referral to Sports Medicine -     MR PELVIS W WO CONTRAST; Future -     Urinalysis w microscopic + reflex cultur  Musculoskeletal disorder Assessment & Plan: Popping joints all over  IgA deficiency (HCC)  Celiac disease  Hearing loss due to cerumen impaction, right  Vitamin D  deficiency  Hyperlipidemia, mixed  Polycythemia     Getting Answers and Following Up  [x]   Simple Questions & Concerns: For quick questions or basic follow-up after your visit, reach us  at [phone] or MyChart messaging. [x]   Complex Concerns: If your concern is more complex, scheduling an appointment might be best. Discuss this with the staff to find the most suitable option. [x]   Lab & Imaging Results: We'll contact you directly if results are abnormal or you don't use  MyChart. Most normal results will be on MyChart within 2-3 business days, with a review message from Dr. Jesus. Haven't heard back in 2 weeks? Need results sooner? Contact us  at [phone]. [x]   Referrals: Our referral coordinator will manage specialist referrals. The specialist's office should contact you within 2 weeks to schedule an appointment. Call us  if you haven't heard from them after 2 weeks.  Staying Connected  [x]   MyChart: Activate your MyChart for the fastest way to access results and message us . See the last page of this paperwork for instructions on how to activate.  Billing  [x]   X-ray & Lab Orders: These are billed by separate companies. Contact the invoicing company directly for questions or concerns. [x]   Visit Charges: Discuss any billing inquiries with our administrative services team.  Your Satisfaction Matters  [x]   Share Your Experience: We strive for your satisfaction! If you have any complaints, or preferably compliments, please let Dr. Jesus know directly or contact our Practice Administrators, Manuelita Rubin or [employer], by asking at the front desk.    Medical Screening Exam A medical screening exam (MSE) helps to determine whether you need immediate medical treatment relating to any number of symptoms you are having. This type of exam may be done in an emergency department, an urgent care setting, or your health care provider's office. Depending on your symptoms and severity, you may need additional tests or medical therapy. It is important to note that an MSE does not necessarily mean that you will need or receive further medical testing or interventions if your symptoms are not deemed to be medically urgent (emergent). Tell a health care provider about: Any allergies you have. All medicines you are taking, including vitamins, herbs, eye drops, creams, and over-the-counter medicines. Any problems you or family members have had with anesthetic  medicines. Any bleeding problems you have. Any surgeries you have had. Any medical conditions you have. Whether you are pregnant or may be pregnant. What happens during the test? During the exam, a health care provider does a short, often focused, physical exam and asks about your medical history to assess: Your current symptoms. Your overall health. Your need for possible further medical intervention. What can I expect after the test? If you have a regular health care provider, make an appointment for a follow-up visit with him or her. If you do not have a regular health care provider, ask about resources in your community. Your medical screening exam may determine that: You do not need emergency treatment at this time.  You need treatment right away. You need to be transferred to another medical center. This may happen if you need an emergent specialist or consultant that is not available at the medical center you are at. You need to have more tests. A medical specialist may be consulted if needed. Get help right away if: Your condition gets worse. You develop new or troubling symptoms before you see your health care provider. These symptoms may represent a serious problem that is an emergency. Do not wait to see if the symptoms will go away. Get medical help right away. Call your local emergency services (911 in the [place] Do not drive yourself to the hospital. Summary A medical screening exam helps to determine whether you need medical treatment right away. This type of exam may be done in an emergency department, an urgent care setting, or your health care provider's office. During the exam, a health care provider does a short physical exam and asks about your current symptoms and overall health. Depending on the exam, more tests or therapies may be ordered. However, an MSE does not necessarily mean that you will have further medical testing if your symptoms are not deemed to be  urgent. If you need further care that is not offered at your current medical center, you may need to be transferred to another facility. This information is not intended to replace advice given to you by your health care provider. Make sure you discuss any questions you have with your health care provider. Document Revised: 10/30/2020 Document Reviewed: 06/27/2020 Elsevier Patient Education  2024 Elsevier Inc.  ?? Trans Fats: What You Need to Know (and How to Avoid Them) Protect Your Heart, Brain, and Overall Health  ? What Are Trans Fats? Trans fats are a type of unhealthy fat that can increase your risk of: Heart disease Stroke Type 2 diabetes Inflammation Memory problems They are artificially made through a process called hydrogenation and were once common in processed foods for better shelf life and texture.  ?? Why Should I Avoid Trans Fats? Even small amounts of trans fats can: Raise "bad" LDL cholesterol Lower "good" HDL cholesterol Cause inflammation in your blood vessels Increase your risk of heart attack or stroke There is no safe level of artificial trans fat.  ?? How to Spot Trans Fats (Even When the Label Says "0g") Food companies can legally say "0 grams trans fat" if the product contains less than 0.5 grams per serving -- but that can add up fast! Look at the ingredients list for these clues: ?? Partially hydrogenated oil ? this means trans fat is present. ? Avoid foods with "shortening" or "hydrogenated" oils.  ?? Common Foods That May Contain Trans Fats Even today, you may find trans fats in: Baked goods (cookies, cakes, pies) Microwave popcorn Crackers Margarine and shortening Fried fast foods Frozen pizza  ? Healthier Choices Choose products with 0g trans fat and no "partially hydrogenated oil" in the ingredients. Use olive oil, avocado oil, or canola oil for cooking. Eat more whole, unprocessed foods: fruits, vegetables, whole grains, and lean  proteins. Choose baked over fried, and fresh over packaged.  ?? Takeaway Message Trans fats are harmful, even in small amounts. To protect your health: Read labels carefully. Look beyond "0g trans fat" and scan for "partially hydrogenated oils." Choose whole foods and heart-healthy fats.    You've raised a really important issue--many in Generation Z are grappling with loneliness and depression, and the fact that dating isn't "solving" much  of it makes sense. Below is a breakdown of what we know, why it's happening, and some actionable ideas for what you (or someone you know) can try. If you like, I can also dig up local / online resources specific to your area [facility], [place]) to help with this.  ? What we know Young people (especially Gen Z) report high levels of loneliness and depression. For example, a meta-analysis found that among young people with depression, loneliness often stems from withdrawing, not sharing the struggle, and feeling a distance from others. [facility]) Loneliness is strongly linked with depression (and vice versa). When you feel disconnected, it can worsen mood; when you're depressed, you're more likely to avoid social contact, which increases loneliness. [facility]) For Gen Z specifically: They tend to feel they have fewer meaningful social connections than older generations. (CREEW) Technology/social media plays a mixed role: it offers connectivity BUT also can deepen a sense of superficial connection or social comparison, which may increase loneliness. ([employer]) Romantic relationships/dating aren't working the same way for many: fewer teen relationships, more time alone or digitally connected instead of face-to-face. ([employer])  ?? Why it may be happening Here are some of the key drivers behind this, not exhaustive but helpful in thinking through things: Reduced in-person social time: Some Gen Z folks report they hang out less, socialize  less in real life (pre-pandemic or post-pandemic) compared to prior generations. ([employer]) Digital substitution: Screens and social media can replace real-world interaction, but often don't give the same depth of connection; sometimes they even highlight what you lack. [employer]) High expectations + social-comparison: With lots of exposure to other people's curated lives (on social media), there may be pressure to present the "right" self, which makes vulnerability more difficult and connection harder. ([employer]) Fewer "third spaces" / community spaces: For younger people, the traditional ways to meet people (in-person clubs, community gatherings, simple hangouts) may feel less common or less natural. Dating environment changes: If dating apps, ghosting, casual "situationships" dominate, that might make forming meaningful romantic connection harder; which means relying more heavily on friendships and other social bonds. Mental health symptoms make connection harder: If you're depressed or anxious, it's harder to reach out, which means you may withdraw, creating the loneliness-cycle. The meta-analysis showed this withdrawal and non-disclosure are key. [facility])  ?? What can help (for you or someone like you) Since dating isn't the full answer (and shouldn't be treated as the only solution), here are some alternative strategies that research and practice suggest help mitigate loneliness/depression. a) Build meaningful connections Focus on small steps: Try increasing social interaction gradually. For example, join a class, club, volunteer group, or interest-based meet-up. Research on "behavioral activation" (getting you doing things even when you don't feel like it) shows this helps. (CREEW) Prioritize quality over quantity: It's less about having tons of social media "friends" and more about 1-2 people you can open up to. The paper about online ties versus core ties showed that  only closer, meaningful ties tend to protect against depression. [employer]) Practice vulnerability & disclosure: Especially if you're experiencing depression or loneliness, letting trusted friends know what's going on helps reduce the distance you feel. The meta-analysis pointed to non-disclosure as a key barrier. [facility]) b) Adjust how you use technology & media Set intentional boundaries on screen time: Especially at night, reducing "doom-scrolling" or late-night social media use helps with sleep, mood, and avoiding social comparison. [employer]) Use technology to enhance  real-life connection, not replace it: For instance, use an app or group chat to coordinate an in-person meet-up rather than purely interacting online. Be aware of your comparison habits: If you find social media makes you feel worse ("everyone else is out having fun") then reduce exposure, unfollow triggers, or take breaks. c) Work on your mental health & internal world Consider therapy or counseling: If depression or loneliness is persistent, professional support can help with coping strategies, cognitive re-framing (how you think about your social world), and skill building. Self-compassion + mindfulness practices: Research suggests that treating yourself kindly (versus harsh self-criticism) supports better social connection and resilience. (CREEW) Healthy lifestyle basics: Sleep, physical activity, nutrition all matter. Poor sleep (which can be disrupted by late screen use) is a risk factor. [employer]) d) Rethink romantic & friendship expectations Since dating may not be the immediate route to solving loneliness: Recognize that friendships and community are equally important to well-being as romantic ties. If you are dating, try to date from a mindset of mutual interest and compatibility, not "dating to fix my loneliness." That avoids putting too much burden on one person. Be open to group social activities  (meet friends together; join group events where the pressure is lower) -- sometimes these feel more comfortable and lead to organic friendships. Accept that "being single" isn't a failure -- social connection can come in many forms and isn't only romantic.  ?? A practical "first week" plan you could try Here's a small week-long plan to test out some practical steps: Day 1: Identify one interest you have (e.g., hiking, board games, music, volunteering). Google a local group/meet-up in your city or town connected to that interest. Day 2: Send a message or RSVP to that group & mark the time in your calendar. Day 3: Think through your screen habits: pick a "screen curfew" (e.g., no social media after 10 pm) for the week. Day 4: Reach out to one existing acquaintance/friend you haven't talked to in a while. Send a message: "Hey, want to catch up this week?" Day 5: Try a short mindfulness or self-compassion exercise (5-10 minutes). For example: "I'm doing the best I can right now, I accept it's hard, and I'm going to take one step." Day 6: Attend the interest-group meetup. If you go, try to talk to at least one person new (it could be as simple as "Hi, I'm . what brought you here?") Day 7: Reflect: what felt good this week? What felt hard? Choose one small adjustment for next week.  Skip to content You said: what helps with gen z loneliness and depression. apparently dating is not much happenign ChatGPT said: You've raised a really important issue--many in Generation Z are grappling with loneliness and depression, and the fact that dating isn't "solving" much of it makes sense. Below is a breakdown of what we know, why it's happening, and some actionable ideas for what you (or someone you know) can try. If you like, I can also dig up local / online resources specific to your area [facility], [place]) to help with this.  ? What we know Young people (especially Gen Z) report high levels of loneliness and  depression. For example, a meta-analysis found that among young people with depression, loneliness often stems from withdrawing, not sharing the struggle, and feeling a distance from others. [employer] Loneliness is strongly linked with depression (and vice versa). When you feel disconnected, it can worsen mood; when you're depressed, you're more likely to avoid  social contact, which increases loneliness. [employer]+2[employer]+2 For Gen Z specifically: They tend to feel they have fewer meaningful social connections than older generations. CREEW+2Psychology Today+2 Technology/social media plays a mixed role: it offers connectivity BUT also can deepen a sense of superficial connection or social comparison, which may increase loneliness. [employer]+1 Romantic relationships/dating aren't working the same way for many: fewer teen relationships, more time alone or digitally connected instead of face-to-face. [employer]+1  ?? Why it may be happening Here are some of the key drivers behind this, not exhaustive but helpful in thinking through things: Reduced in-person social time: Some Gen Z folks report they hang out less, socialize less in real life (pre-pandemic or post-pandemic) compared to prior generations. [employer]+1 Digital substitution: Screens and social media can replace real-world interaction, but often don't give the same depth of connection; sometimes they even highlight what you lack. [employer]+1 High expectations + social-comparison: With lots of exposure to other people's curated lives (on social media), there may be pressure to present the "right" self, which makes vulnerability more difficult and connection harder. Psychology Today Fewer "third spaces" / community spaces: For younger people, the traditional ways to meet people (in-person clubs, community gatherings, simple hangouts) may feel less common or less natural. Dating environment changes: If dating apps,  ghosting, casual "situationships" dominate, that might make forming meaningful romantic connection harder; which means relying more heavily on friendships and other social bonds. Mental health symptoms make connection harder: If you're depressed or anxious, it's harder to reach out, which means you may withdraw, creating the loneliness-cycle. The meta-analysis showed this withdrawal and non-disclosure are key. [employer]  ?? What can help (for you or someone like you) Since dating isn't the full answer (and shouldn't be treated as the only solution), here are some alternative strategies that research and practice suggest help mitigate loneliness/depression. a) Build meaningful connections Focus on small steps: Try increasing social interaction gradually. For example, join a class, club, volunteer group, or interest-based meet-up. Research on "behavioral activation" (getting you doing things even when you don't feel like it) shows this helps. CREEW Prioritize quality over quantity: It's less about having tons of social media "friends" and more about 1-2 people you can open up to. The paper about online ties versus core ties showed that only closer, meaningful ties tend to protect against depression. [employer] Practice vulnerability & disclosure: Especially if you're experiencing depression or loneliness, letting trusted friends know what's going on helps reduce the distance you feel. The meta-analysis pointed to non-disclosure as a key barrier. [employer] b) Adjust how you use technology & media Set intentional boundaries on screen time: Especially at night, reducing "doom-scrolling" or late-night social media use helps with sleep, mood, and avoiding social comparison. [place] to enhance real-life connection, not replace it: For instance, use an app or group chat to coordinate an in-person meet-up rather than purely interacting online. Be aware of your comparison habits: If you  find social media makes you feel worse ("everyone else is out having fun") then reduce exposure, unfollow triggers, or take breaks. c) Work on your mental health & internal world Consider therapy or counseling: If depression or loneliness is persistent, professional support can help with coping strategies, cognitive re-framing (how you think about your social world), and skill building. Self-compassion + mindfulness practices: Research suggests that treating yourself kindly (versus harsh self-criticism) supports better social connection and resilience. CREEW Healthy lifestyle basics: Sleep, physical activity, nutrition all  matter. Poor sleep (which can be disrupted by late screen use) is a risk factor. [employer] d) Rethink romantic & friendship expectations Since dating may not be the immediate route to solving loneliness: Recognize that friendships and community are equally important to well-being as romantic ties. If you are dating, try to date from a mindset of mutual interest and compatibility, not "dating to fix my loneliness." That avoids putting too much burden on one person. Be open to group social activities (meet friends together; join group events where the pressure is lower) -- sometimes these feel more comfortable and lead to organic friendships. Accept that "being single" isn't a failure -- social connection can come in many forms and isn't only romantic.  ?? A practical "first week" plan you could try Here's a small week-long plan to test out some practical steps: Day 1: Identify one interest you have (e.g., hiking, board games, music, volunteering). Google a local group/meet-up in your city or town connected to that interest. Day 2: Send a message or RSVP to that group & mark the time in your calendar. Day 3: Think through your screen habits: pick a "screen curfew" (e.g., no social media after 10 pm) for the week. Day 4: Reach out to one existing acquaintance/friend you haven't  talked to in a while. Send a message: "Hey, want to catch up this week?" Day 5: Try a short mindfulness or self-compassion exercise (5-10 minutes). For example: "I'm doing the best I can right now, I accept it's hard, and I'm going to take one step." Day 6: Attend the interest-group meetup. If you go, try to talk to at least one person new (it could be as simple as "Hi, I'm . what brought you here?") Day 7: Reflect: what felt good this week? What felt hard? Choose one small adjustment for next week.  If you like, I can find a list of free or low-cost local resources in [place], [place] (or online) specifically for Gen Z individuals experiencing loneliness/depression -- would that be helpful? You said: he likes soccer, music, gaming, and hiking he is in [place] area how can he connect with real people through these hobbies ChatGPT said: Great -- there are lots of ways your friend (in the [place], [place] area) who enjoys soccer, music, gaming and hiking can start connecting with people in real life, not just online. Here are three solid local options (one for each hobby) + general tips. If he tells me his age / timeframe I can pull a tailored weekly-event list.  1) Soccer [employer] [facility] [place]) 4.4Soccer field    6 This is a full-sized turf complex in [place], good for finding adult leagues or pickup games. Visit [place] Tip: He can check for "adult recreational soccer" or "pickup" sessions at this facility or its managing club (e.g., the local soccer club based here). Why it helps: Being part of a team or recurring game means regular meetups, shared mission (playing soccer) ? natural bonding. Extra note: When showing up, aim for being consistent (same day/time) so people recognise you. Maybe stay after the game for a drink or chat.  2) Hiking / Outdoors Get[employer]iking! [place]    6 This is a Meetup group based in the [place] focused on hiking: "Administrator  friendly . you never hike alone (unless you want to!)." Meetup+1 Tip: He can join via Meetup, show up to one of their events -- maybe choose an easier/shorter hike so the social part is comfortable.  Bring water/snacks, and treat the hike as "talk & move" rather than "serious fitness only". Why it helps: Movement + nature + group vibe = good combination for connection & mood improvement. Extra: Because hobbies like hiking also give space for chat (you're walking side-by-side rather than face-to-face only) it can ease the pressure of "making friends".  3) Gaming / Social Journalist, Newspaper + Arcade 4.6BarClosed    6 This place is an agricultural engineer in downtown [place] and is a strong option for socializing through gaming. Tip: He could go with a friend (or solo!) during an event night or just open-hour gaming. Maybe challenge someone to a game, chat. Why it helps: Gaming + face-to-face setting gives a shared activity, helps reduce "awkwardness" of starting a conversation. Additional idea: Look up board-/tabletop-gaming groups (for example, on Reddit people in [place] say there are board game nights) and consider joining them. Reddit+1  ? Some general "connect-better" tips Pick one hobby and aim for regularity. For example: join the hiking group once a month, commit, so people start recognising you. Have small goals: "Introduce myself to one new person", "Stay 10 minutes after the game/event to chat". Use the hobby as conversation starter: "Which position do you like to play?" in soccer; "What trails have you done?" in hiking; "Which game do you prefer?" in gaming. Mix casual & recurring: One-off events are fine but recurring groups build familiarity. Bring your 'intersection' interests: He likes music too -- maybe find a gaming or hiking group that also enjoys music, or connect in that context. Be okay with the "awkward start": Many others there are likely looking for connections too (as Reddit  posts show). For example: "Hi all! Me and my partner [place] moved to [place] . I'm looking to put myself out there and find community." Reddit Balance screen/online + in-person: It's fine to use online groups to find events, but aim for face-to-face too.  If you like, I can scan for upcoming events this month (in the [place] region) in those three hobby categories (soccer meetups, hiking outings, gaming nights) and send a calendar-style list that he can pick from. Would you like me to pull that? Attach Search Study Voice ChatGPT can make mistakes. Check important info.

## 2023-12-27 NOTE — Assessment & Plan Note (Signed)
 Immunoglobulin A (IgA) deficiency   IgA deficiency contributes to immunosuppression, increasing the risk of infections, including COVID-19. Despite the recommendation, he declined COVID-19 vaccination due to concerns about immunosuppression and asthma. The potential benefits of vaccination were discussed.

## 2023-12-27 NOTE — Assessment & Plan Note (Signed)
 He has cerumen impaction in the right ear causing blockage, which may impact hearing and balance. Ear irrigation by a nurse is offered, and over-the-counter earwax removal products are recommended.

## 2023-12-27 NOTE — Assessment & Plan Note (Addendum)
 Pelvic pain and popping, possible musculoskeletal etiology   He experiences pelvic pain and popping, possibly due to a musculoskeletal issue such as muscle injury, stress fracture, or hernia. Popping occurs with certain movements and sneezing. Physical exam did not provide a clear diagnosis. An MRI is recommended for further evaluation due to its comprehensive nature compared to ultrasound. He will be referred to sports medicine for further evaluation.  Popping of multiple joints   He has popping in multiple joints, possibly related to a genetic issue affecting tendons and muscles. There is no pain associated with the popping, but there is concern for potential future arthritis. A referral to sports medicine for evaluation of joint popping is advised.

## 2023-12-28 ENCOUNTER — Ambulatory Visit: Payer: Self-pay | Admitting: Internal Medicine

## 2023-12-28 DIAGNOSIS — R748 Abnormal levels of other serum enzymes: Secondary | ICD-10-CM | POA: Insufficient documentation

## 2023-12-28 DIAGNOSIS — E8809 Other disorders of plasma-protein metabolism, not elsewhere classified: Secondary | ICD-10-CM | POA: Insufficient documentation

## 2023-12-28 LAB — URINALYSIS W MICROSCOPIC + REFLEX CULTURE
Bacteria, UA: NONE SEEN /HPF
Bilirubin Urine: NEGATIVE
Glucose, UA: NEGATIVE
Hgb urine dipstick: NEGATIVE
Hyaline Cast: NONE SEEN /LPF
Ketones, ur: NEGATIVE
Leukocyte Esterase: NEGATIVE
Nitrites, Initial: NEGATIVE
Protein, ur: NEGATIVE
Specific Gravity, Urine: 1.02 (ref 1.001–1.035)
Squamous Epithelial / HPF: NONE SEEN /HPF (ref <=5)
WBC, UA: NONE SEEN /HPF (ref 0–5)
pH: 7 (ref 5.0–8.0)

## 2023-12-28 LAB — TSH RFX ON ABNORMAL TO FREE T4: TSH: 1.13 u[IU]/mL (ref 0.450–4.500)

## 2023-12-28 LAB — NO CULTURE INDICATED

## 2024-01-04 ENCOUNTER — Encounter: Payer: Self-pay | Admitting: Family Medicine

## 2024-01-04 ENCOUNTER — Ambulatory Visit (INDEPENDENT_AMBULATORY_CARE_PROVIDER_SITE_OTHER)

## 2024-01-04 ENCOUNTER — Other Ambulatory Visit: Payer: Self-pay

## 2024-01-04 ENCOUNTER — Ambulatory Visit: Admitting: Family Medicine

## 2024-01-04 VITALS — BP 124/82 | HR 74 | Ht 66.0 in | Wt 132.0 lb

## 2024-01-04 DIAGNOSIS — R1031 Right lower quadrant pain: Secondary | ICD-10-CM

## 2024-01-04 NOTE — Patient Instructions (Addendum)
 Thank you for coming in today.   Please get an Xray today before you leave   A referral for physical therapy has been submitted. A representative from the physical therapy office will contact you to coordinate scheduling after confirming your benefits with your insurance provider. If you do not hear from the physical therapy office within the next 1-2 weeks, please let us  know.   OK to cancel the MRI that is scheduled.  See you back in 8 weeks.

## 2024-01-04 NOTE — Progress Notes (Unsigned)
   I, Leotis Batter, CMA acting as a scribe for Artist Lloyd, MD.  Bradley Esparza is a 23 y.o. male who presents to [facility] at [facility] today for pelvic pain x 3 months. Pt locates pain to right side. Sx worse after running. Denies nausea, reports regular BM's. No meds for sx. Denies history of sports hernia.   Aggravates: after running Treatments tried: warm water soaking  Pertinent review of systems: No fevers or chills  Relevant historical information: Celiac disease and IgA deficiency.   Exam:  BP 124/82   Pulse 74   Ht 5' 6 (1.676 m)   Wt 132 lb (59.9 kg)   SpO2 98%   BMI 21.31 kg/m  General: Well Developed, well nourished, and in no acute distress.   MSK: Normal hip motion and strength.  Abdomen and pelvis: Right hip pelvis region normal-appearing Fullness palpated right inguinal area without significant tenderness.  No masses palpated. No discrete hernia present with palpation of the external inguinal ring.  Testicles are nontender to palpation.     Lab and Radiology Results  X-ray images right hip obtained today personally and independently interpreted. No acute fractures no significant degenerative changes. Await formal radiology review    Assessment and Plan: 23 y.o. male with right inguinal pain and fullness following running.  Sports hernia is quite likely based on physical exam.  Plan for trial of physical therapy if not improving we will proceed with MRI.  Of note he does have an MRI pelvis with and without contrast ordered and scheduled for December 5.  I think we can hold off on that and modify the protocol in the future if needed.   PDMP not reviewed this encounter. Orders Placed This Encounter  Procedures   DG HIP UNILAT W OR W/O PELVIS 2-3 VIEWS RIGHT    Standing Status:   Future    Number of Occurrences:   1    Expiration Date:   02/03/2024    Reason for Exam (SYMPTOM  OR DIAGNOSIS REQUIRED):   right hip / groin pain     Preferred imaging location?:   [facility]   Ambulatory referral to Physical Therapy    Referral Priority:   Routine    Referral Type:   Physical Medicine    Referral Reason:   Specialty Services Required    Requested Specialty:   Physical Therapy    Number of Visits Requested:   1   No orders of the defined types were placed in this encounter.    Discussed warning signs or symptoms. Please see discharge instructions. Patient expresses understanding.   The above documentation has been reviewed and is accurate and complete Artist Lloyd, M.D.

## 2024-01-10 ENCOUNTER — Ambulatory Visit: Payer: Self-pay | Admitting: Family Medicine

## 2024-01-10 NOTE — Progress Notes (Signed)
 Right hip x-ray looks normal to radiology

## 2024-01-12 ENCOUNTER — Encounter: Payer: Self-pay | Admitting: Physical Therapy

## 2024-01-12 ENCOUNTER — Ambulatory Visit: Admitting: Physical Therapy

## 2024-01-12 DIAGNOSIS — R102 Pelvic and perineal pain unspecified side: Secondary | ICD-10-CM

## 2024-01-12 DIAGNOSIS — R1031 Right lower quadrant pain: Secondary | ICD-10-CM

## 2024-01-12 NOTE — Therapy (Signed)
 OUTPATIENT PHYSICAL THERAPY LOWER EXTREMITY EVALUATION   Patient Name: Bradley Esparza MRN: [id number] DOB:2000-10-12, 23 y.o., male Today's Date: 01/12/2024  END OF SESSION:  PT End of Session - 01/12/24 1133     Visit Number 1    Number of Visits 16    Date for Recertification  03/08/24    Authorization Type BCBS    PT Start Time 1018    PT Stop Time 1100    PT Time Calculation (min) 42 min    Activity Tolerance Patient tolerated treatment well    Behavior During Therapy WFL for tasks assessed/performed          Past Medical History:  Diagnosis Date   Allergic rhinitis    Angio-edema    Anxiety    Asthma    Celiac disease 05/23/2019   Depression    Eczema    GERD (gastroesophageal reflux disease)    Hematuria    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   Urticaria    Vitamin D  deficiency 07/08/2020   Past Surgical History:  Procedure Laterality Date   COLONOSCOPY  05/2019   CYSTECTOMY  2006   eyebrow   ESOPHAGOGASTRODUODENOSCOPY  05/2019   TYMPANOSTOMY TUBE PLACEMENT     Urethoplasty     Patient Active Problem List   Diagnosis Date Noted   Elevated alkaline phosphatase level 12/28/2023   Hyperalbuminemia 12/28/2023   Depressive disorder 12/27/2023   Hearing loss due to cerumen impaction, right 12/27/2023   Musculoskeletal disorder 12/27/2023   Pelvic pain 12/27/2023   Bulge in groin area 12/27/2023   Hyperlipidemia, mixed 12/27/2023   Polycythemia 12/27/2023   Tinnitus of both ears 12/22/2022   IgA deficiency (HCC) 12/30/2020   Vitamin D  deficiency 07/08/2020   History of asthma 08/08/2019   Low serum IgA and IgM levels (HCC) 05/23/2019   Celiac disease 05/23/2019     PCP: [facility]  REFERRING PROVIDER: Artist Lloyd  REFERRING DIAG: R groin pain   THERAPY DIAG:  Right groin pain  Pelvic pain  Rationale for Evaluation and Treatment: Rehabilitation  ONSET DATE: 3 months ago   SUBJECTIVE:   SUBJECTIVE STATEMENT: Pt states ongoing pain  for a few months. No incident to report. 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 (Still playing but does have pain afterwards.)  He notes pain in R side of groin, as well as pain deep in pelvic floor.   Pain on R groin with hip IR,  work duties and playing soccer.  Pelvic floor pain mostly after running/soccer.  Feels these 2 pains can be independent of eachother.  Pain has not improved since onset.  No radiating pain, no numb/tingling  , no pain with abdominal work or going to bathroom.  No pain/pressure in pelvic floor with sitting but does have pain/pop with getting up from seated for a while    PERTINENT HISTORY: Celiac disease,    PAIN:  Are you having pain? Yes: NPRS scale: 5/10 Pain location: R groin,  deep inside 6-7/10 - separate Pain description: sore -  Aggravating factors: soccer, work duties  Relieving factors: rest    PRECAUTIONS: None  WEIGHT BEARING RESTRICTIONS: No  FALLS:  Has patient fallen in last 6 months? No   PLOF: Independent  PATIENT GOALS:  Decreased pain in groin and pelvic floor   NEXT MD VISIT:   OBJECTIVE:   DIAGNOSTIC FINDINGS:    PATIENT SURVEYS:    COGNITION: Overall cognitive  status: Within functional limits for tasks assessed     SENSATION: WFL  EDEMA: n/a   POSTURE:    No Significant postural limitations  PALPATION: Minimal pain to palpate R groin line,    LOWER EXTREMITY ROM: Hip ROM: WFL Knee: WFL Lumbar: WFL   LOWER EXTREMITY MMT:  MMT Left eval Right  eval  Hip flexion 4+ 4+  Hip extension    Hip abduction 4+ 4+  Hip adduction    Hip internal rotation    Hip external rotation    Knee flexion 5 5  Knee extension 5 5  Ankle dorsiflexion    Ankle plantarflexion    Ankle inversion    Ankle eversion     (Blank rows = not tested)  LOWER EXTREMITY SPECIAL TESTS:    FUNCTIONAL TESTS:    GAIT: Unremarkable    TODAY'S TREATMENT:                                                                                                                               DATE:   01/12/24:  Ther ex: See below for HEP   PATIENT EDUCATION:  Education details: PT POC, Exam findings, HEP Person educated: Patient Education method: Explanation, Demonstration, Tactile cues, Verbal cues, and Handouts Education comprehension: verbalized understanding, returned demonstration, verbal cues required, tactile cues required, and needs further education   HOME EXERCISE PROGRAM: Access Code: [email or web address] URL: [email or web address] Date: 01/12/2024 Prepared by: Tinnie Don  Exercises - Bent Knee Fallouts  - 1 x daily - 1 sets - 10 reps - 3 sec  hold - Clamshell  - 1 x daily - 1-2 sets - 10 reps - Half Kneeling Hip Flexor Stretch  - 1 x daily - 3 reps - 20 hold - Standing Hip Flexor Stretch  - 2 x daily - 3 reps - 20 hold - Side Lunge Adductor Stretch  - 2 x daily - 5 reps - 15 hold  ASSESSMENT:   CLINICAL IMPRESSION: Patient presents with primary complaint of  pain in groin that is limiting his activity. He has increased pain on R side of inguinal line as well as in pelvic floor. He has minimal pain to reproduce today, but has limitations with work duties and exercise. Discussed possible referral to pelvic floor PT in the future if needed for more thorough exam of pelvic floor pain.  Pt with decreased ability for full functional activities. Pt will  benefit from skilled PT to improve deficits and pain and to return to PLOF.   OBJECTIVE IMPAIRMENTS: decreased activity tolerance, decreased strength, increased muscle spasms, impaired flexibility, and pain.   ACTIVITY LIMITATIONS: lifting, bending, sitting, squatting, stairs, and locomotion level  PARTICIPATION LIMITATIONS: community activity and occupation  PERSONAL FACTORS: none are also affecting patient's functional outcome.   REHAB POTENTIAL: Good  CLINICAL DECISION MAKING:  Stable/uncomplicated  EVALUATION COMPLEXITY: Low   GOALS: Goals reviewed with patient? Yes  SHORT TERM GOALS: Target date:  02/02/2024   Pt to be independent with initial HEP  Goal status: INITIAL  2.  Pt to report decreased pain to 3/10   Goal status: INITIAL    LONG TERM GOALS: Target date: 03/08/2024   Pt to be independent with final HEP  Goal status: INITIAL  2.  Pt to report decreased pain in hip and groin to 0-2/10 with soccer and work duties.   Goal status: INITIAL  3.  Pt to demo ability for bend, lift, squat as well a sit to stand without pain or popping sensation.   Goal status: INITIAL  4.  Pt to demo rom and strength, stability  of bil hips to be [facility] , to improve ability for exercise, soccer and work duties .   Goal status: INITIAL    PLAN:  PT FREQUENCY: 1-2x/week  PT DURATION: 8 weeks  PLANNED INTERVENTIONS: Therapeutic exercises, Therapeutic activity, Neuromuscular re-education, Patient/Family education, Self Care, Joint mobilization, Joint manipulation, Stair training, Orthotic/Fit training, DME instructions, Aquatic Therapy, Dry Needling, Electrical stimulation, Cryotherapy, Moist heat, Taping, Ultrasound, Ionotophoresis 4mg /ml Dexamethasone, Manual therapy,  Vasopneumatic device, Traction, Spinal manipulation, Spinal mobilization,Balance training, Gait training,   PLAN FOR NEXT SESSION:    Tinnie Don, PT, DPT 11:38 AM  01/12/24

## 2024-01-21 ENCOUNTER — Ambulatory Visit: Admitting: Physical Therapy

## 2024-01-21 DIAGNOSIS — R1031 Right lower quadrant pain: Secondary | ICD-10-CM

## 2024-01-21 DIAGNOSIS — R102 Pelvic and perineal pain unspecified side: Secondary | ICD-10-CM

## 2024-01-21 NOTE — Therapy (Signed)
 OUTPATIENT PHYSICAL THERAPY LOWER EXTREMITY TREATMENT   Patient Name: Bradley Esparza MRN: [id number] DOB:05-10-2000, 23 y.o., male Today's Date: 01/21/2024  END OF SESSION:  PT End of Session - 01/21/24 0955     Visit Number 2    Number of Visits 16    Date for Recertification  03/08/24    Authorization Type BCBS    PT Start Time 9060    PT Stop Time 1018    PT Time Calculation (min) 39 min    Activity Tolerance Patient tolerated treatment well    Behavior During Therapy [facility] for tasks assessed/performed           Past Medical History:  Diagnosis Date   Allergic rhinitis    Angio-edema    Anxiety    Asthma    Celiac disease 05/23/2019   Depression    Eczema    GERD (gastroesophageal reflux disease)    Hematuria    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   Urticaria    Vitamin D  deficiency 07/08/2020   Past Surgical History:  Procedure Laterality Date   COLONOSCOPY  05/2019   CYSTECTOMY  2006   eyebrow   ESOPHAGOGASTRODUODENOSCOPY  05/2019   TYMPANOSTOMY TUBE PLACEMENT     Urethoplasty     Patient Active Problem List   Diagnosis Date Noted   Elevated alkaline phosphatase level 12/28/2023   Hyperalbuminemia 12/28/2023   Depressive disorder 12/27/2023   Hearing loss due to cerumen impaction, right 12/27/2023   Musculoskeletal disorder 12/27/2023   Pelvic pain 12/27/2023   Bulge in groin area 12/27/2023   Hyperlipidemia, mixed 12/27/2023   Polycythemia 12/27/2023   Tinnitus of both ears 12/22/2022   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 Feeling adductor pain really just with straight leg adduction, and pelvic floor pain only with running but has not done much of that.  Does have pain in front of R shoulder - thinks from lifting at work.   Eval: Pt states ongoing pain for a few months. No incident to report. 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 (Still playing but does have pain afterwards.)  He notes pain in R side of groin, as well as pain deep in pelvic floor.   Pain on R groin with hip IR,  work duties and playing soccer.  Pelvic floor pain mostly after running/soccer.  Feels these 2 pains can be independent of eachother.  Pain has not improved since onset.  No radiating pain, no numb/tingling  , no pain with abdominal work or going to bathroom.  No pain/pressure in pelvic floor with sitting but does have pain/pop with getting up from seated for a while    PERTINENT HISTORY: Celiac disease,    PAIN:  Are you having pain? Yes: NPRS scale: 5/10 Pain location: R groin,  deep inside 6-7/10 - separate Pain description: sore -  Aggravating factors: soccer, work duties  Relieving factors: rest    PRECAUTIONS: None  WEIGHT BEARING RESTRICTIONS: No  FALLS:  Has patient fallen in last 6 months? No   PLOF: Independent  PATIENT GOALS:  Decreased pain in groin and pelvic floor   NEXT MD VISIT:   OBJECTIVE:   DIAGNOSTIC FINDINGS:    PATIENT SURVEYS:    COGNITION: Overall cognitive status: Within functional limits for tasks assessed     SENSATION: WFL  EDEMA: n/a   POSTURE:    No Significant postural limitations  PALPATION: Minimal pain to palpate R groin line,    LOWER EXTREMITY ROM: Hip ROM: WFL Knee: WFL Lumbar: WFL   LOWER EXTREMITY MMT:  MMT Left eval Right  eval  Hip flexion 4+ 4+  Hip extension    Hip abduction 4+ 4+  Hip adduction    Hip internal rotation    Hip external rotation    Knee flexion 5 5  Knee extension 5  5  Ankle dorsiflexion    Ankle plantarflexion    Ankle inversion    Ankle eversion     (Blank rows = not tested)  LOWER EXTREMITY SPECIAL TESTS:    FUNCTIONAL TESTS:    GAIT: Unremarkable    TODAY'S TREATMENT:                                                                                                                              DATE:   01/21/24: Therapeutic Exercise: Aerobic:  Bike L2 x 6 min Supine:  hip ER fallouts x 15;   Bridging 2 x 10; happy baby review for HEP, seated butterfly- review of mechanics for HEP  S/L:  clams x 15 bil;  Seated: Standing: lateral step ups x 15 bil- 6 in (tried opp knee drive- challenging)  Stretches:  piriformis x 3 bil; standing hip add stretch at counter x 2 min;  Neuromuscular Re-education: Manual Therapy: Therapeutic Activity: Self Care:   01/12/24:  Ther ex: See below for HEP   PATIENT EDUCATION:  Education details: updated and reviewed HEP Person educated: Patient Education method: Explanation, Demonstration, Tactile cues, Verbal cues, and Handouts Education comprehension: verbalized understanding, returned demonstration, verbal cues required, tactile cues required, and needs further education   HOME EXERCISE PROGRAM: Access Code: [email or web address] URL: [email or web address] Date: 01/12/2024 Prepared by: Tinnie Don  Exercises - Bent Knee Fallouts  - 1 x daily - 1 sets - 10 reps - 3 sec  hold - Clamshell  - 1 x daily - 1-2 sets - 10 reps - Half Kneeling Hip Flexor Stretch  - 1 x daily - 3 reps - 20 hold - Standing Hip Flexor Stretch  - 2 x daily - 3 reps - 20 hold - Side Lunge Adductor Stretch  - 2 x daily - 5 reps - 15 hold  ASSESSMENT:   CLINICAL IMPRESSION: 01/21/2024 Pt with good tolerance for activity today without pain.  Now that soccer is over, we Discussed trying to do some jogging/walking activity in the next couple weeks to see how pain feels. Recommended interval walk/jog.  Eval: Patient  presents with primary complaint of  pain in groin that is limiting his activity. He has increased pain on R side of inguinal line as well as in pelvic floor. He has minimal pain to reproduce today, but has limitations with work duties and exercise. Discussed possible referral to pelvic floor PT in the future if needed for more thorough exam of pelvic floor pain.  Pt with decreased ability for full functional activities. Pt will  benefit from skilled PT to improve deficits and pain and to return to PLOF.   OBJECTIVE IMPAIRMENTS: decreased activity tolerance, decreased strength, increased muscle spasms, impaired flexibility, and pain.   ACTIVITY LIMITATIONS: lifting, bending, sitting, squatting, stairs, and locomotion level  PARTICIPATION LIMITATIONS: community activity and occupation  PERSONAL FACTORS: none are also affecting patient's functional outcome.   REHAB POTENTIAL: Good  CLINICAL DECISION MAKING: Stable/uncomplicated  EVALUATION COMPLEXITY: Low   GOALS: Goals reviewed with patient? Yes  SHORT TERM GOALS: Target date:  02/02/2024   Pt to be independent with initial HEP  Goal status: INITIAL  2.  Pt to report decreased pain to 3/10   Goal status: INITIAL    LONG TERM GOALS: Target date: 03/08/2024   Pt to be independent with final HEP  Goal status: INITIAL  2.  Pt to report decreased pain in hip and groin to 0-2/10 with soccer and work duties.   Goal status: INITIAL  3.  Pt to demo ability for bend, lift, squat as well a sit to stand without pain or popping sensation.   Goal status: INITIAL  4.  Pt to demo rom and strength, stability  of bil hips to be [facility] , to improve ability for exercise, soccer and work duties .   Goal status: INITIAL    PLAN:  PT FREQUENCY: 1-2x/week  PT DURATION: 8 weeks  PLANNED INTERVENTIONS: Therapeutic exercises, Therapeutic activity, Neuromuscular re-education, Patient/Family education, Self Care, Joint mobilization, Joint  manipulation, Stair training, Orthotic/Fit training, DME instructions, Aquatic Therapy, Dry Needling, Electrical stimulation, Cryotherapy, Moist heat, Taping, Ultrasound, Ionotophoresis 4mg /ml Dexamethasone, Manual therapy,  Vasopneumatic device, Traction, Spinal manipulation, Spinal mobilization,Balance training, Gait training,   PLAN FOR NEXT SESSION:    Tinnie Don, PT, DPT 10:18 AM  01/21/24

## 2024-02-02 ENCOUNTER — Ambulatory Visit: Admitting: Physical Therapy

## 2024-02-02 ENCOUNTER — Encounter: Payer: Self-pay | Admitting: Physical Therapy

## 2024-02-02 DIAGNOSIS — R1031 Right lower quadrant pain: Secondary | ICD-10-CM | POA: Diagnosis not present

## 2024-02-02 DIAGNOSIS — R102 Pelvic and perineal pain unspecified side: Secondary | ICD-10-CM

## 2024-02-02 NOTE — Therapy (Signed)
 OUTPATIENT PHYSICAL THERAPY LOWER EXTREMITY TREATMENT   Patient Name: Bradley Esparza MRN: [id number] DOB:2001/02/15, 23 y.o., male Today's Date: 02/02/2024  END OF SESSION:  PT End of Session - 02/02/24 0959     Visit Number 3    Number of Visits 16    Date for Recertification  03/08/24    Authorization Type BCBS    PT Start Time 0941    PT Stop Time 1019    PT Time Calculation (min) 38 min    Activity Tolerance Patient tolerated treatment well    Behavior During Therapy [facility] for tasks assessed/performed            Past Medical History:  Diagnosis Date   Allergic rhinitis    Angio-edema    Anxiety    Asthma    Celiac disease 05/23/2019   Depression    Eczema    GERD (gastroesophageal reflux disease)    Hematuria    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   Urticaria    Vitamin D  deficiency 07/08/2020   Past Surgical History:  Procedure Laterality Date   COLONOSCOPY  05/2019   CYSTECTOMY  2006   eyebrow   ESOPHAGOGASTRODUODENOSCOPY  05/2019   TYMPANOSTOMY TUBE PLACEMENT     Urethoplasty     Patient Active Problem List   Diagnosis Date Noted   Elevated alkaline phosphatase level 12/28/2023   Hyperalbuminemia 12/28/2023   Depressive disorder 12/27/2023   Hearing loss due to cerumen impaction, right 12/27/2023   Musculoskeletal disorder 12/27/2023   Pelvic pain 12/27/2023   Bulge in groin area 12/27/2023   Hyperlipidemia, mixed 12/27/2023   Polycythemia 12/27/2023   Tinnitus of both ears 12/22/2022   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 States no pain,  but also has been less active, has not run or played soccer to test it out.   Eval: Pt states ongoing pain for a few months. No incident to report. 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 (Still playing but does have pain afterwards.)  He notes pain in R side of groin, as well as pain deep in pelvic floor.   Pain on R groin with hip IR,  work duties and playing soccer.  Pelvic floor pain mostly after running/soccer.  Feels these 2 pains can be independent of eachother.  Pain has not improved since onset.  No radiating pain, no numb/tingling  , no pain with abdominal work or going to bathroom.  No pain/pressure in pelvic floor with sitting but does have pain/pop with getting up from seated for a while    PERTINENT HISTORY: Celiac disease,    PAIN:  Are you having pain? Yes: NPRS scale: 0/10 Pain location: R groin,  deep inside 0/10 - separate Pain description: sore -  Aggravating factors: soccer, work duties  Relieving factors: rest    PRECAUTIONS: None  WEIGHT BEARING RESTRICTIONS: No  FALLS:  Has patient fallen in last 6 months? No   PLOF: Independent  PATIENT GOALS:  Decreased  pain in groin and pelvic floor   NEXT MD VISIT:   OBJECTIVE:   DIAGNOSTIC FINDINGS:    PATIENT SURVEYS:    COGNITION: Overall cognitive status: Within functional limits for tasks assessed     SENSATION: WFL  EDEMA: n/a   POSTURE:    No Significant postural limitations  PALPATION: Minimal pain to palpate R groin line,    LOWER EXTREMITY ROM: Hip ROM: WFL Knee: WFL Lumbar: WFL   LOWER EXTREMITY MMT:  MMT Left eval Right  eval  Hip flexion 4+ 4+  Hip extension    Hip abduction 4+ 4+  Hip adduction    Hip internal rotation    Hip external rotation    Knee flexion 5 5  Knee extension 5 5  Ankle dorsiflexion    Ankle plantarflexion    Ankle inversion    Ankle eversion     (Blank rows = not  tested)  LOWER EXTREMITY SPECIAL TESTS:    FUNCTIONAL TESTS:    GAIT: Unremarkable    TODAY'S TREATMENT:                                                                                                                              DATE:   02/02/24: Therapeutic Exercise: Aerobic:  Bike L2 x 6 min Supine:  hip ER fallouts x 15;   Bridging  x 10;  SL bridge x3 then x 5 bil;   S/L:  clams x 15 bil with GTB;    side plank with top leg raise x 3 bil;  Seated: Standing:  Stretches:  Seated piriformis x 3 bil;  standing hip add stretch at counter x 2 min;  Neuromuscular Re-education: Manual Therapy: Therapeutic Activity: Dynamic stretching: high knees, tin man, butt kicks x 5 each;   Squats: 15 lb x 20;   Self Care:    01/21/24: Therapeutic Exercise: Aerobic:  Bike L2 x 6 min Supine:  hip ER fallouts x 15;   Bridging 2 x 10; happy baby review for HEP, seated butterfly- review of mechanics for HEP  S/L:  clams x 15 bil;  Seated: Standing: lateral step ups x 15 bil- 6 in (tried opp knee drive- challenging)  Stretches:  piriformis x 3 bil; standing hip add stretch at counter x 2 min;  Neuromuscular Re-education: Manual Therapy: Therapeutic Activity: Self Care:   01/12/24:  Ther ex: See below for HEP   PATIENT EDUCATION:  Education details: updated and reviewed HEP Person educated: Patient Education method: Explanation, Demonstration, Tactile cues, Verbal cues, and Handouts Education comprehension: verbalized understanding, returned demonstration, verbal cues required, tactile cues required, and needs further education   HOME EXERCISE PROGRAM: Access Code: [email or web address] URL: [email or web address] Date: 01/12/2024 Prepared by: Tinnie Don  Exercises - Bent Knee Fallouts  - 1 x daily - 1 sets - 10 reps - 3 sec  hold - Clamshell  - 1 x daily - 1-2 sets - 10 reps - Half Kneeling  Hip Flexor Stretch  - 1 x daily - 3 reps - 20 hold - Standing Hip Flexor  Stretch  - 2 x daily - 3 reps - 20 hold - Side Lunge Adductor Stretch  - 2 x daily - 5 reps - 15 hold  ASSESSMENT:   CLINICAL IMPRESSION: 02/02/2024  Pt with good tolerance for activity today without pain. He is progressing very well.  Now that soccer is over, we Discussed trying to do some jogging/walking activity in the next couple weeks to see how pain feels. Recommended interval walk/jog with a warm up prior.   Eval: Patient presents with primary complaint of  pain in groin that is limiting his activity. He has increased pain on R side of inguinal line as well as in pelvic floor. He has minimal pain to reproduce today, but has limitations with work duties and exercise. Discussed possible referral to pelvic floor PT in the future if needed for more thorough exam of pelvic floor pain.  Pt with decreased ability for full functional activities. Pt will  benefit from skilled PT to improve deficits and pain and to return to PLOF.   OBJECTIVE IMPAIRMENTS: decreased activity tolerance, decreased strength, increased muscle spasms, impaired flexibility, and pain.   ACTIVITY LIMITATIONS: lifting, bending, sitting, squatting, stairs, and locomotion level  PARTICIPATION LIMITATIONS: community activity and occupation  PERSONAL FACTORS: none are also affecting patient's functional outcome.   REHAB POTENTIAL: Good  CLINICAL DECISION MAKING: Stable/uncomplicated  EVALUATION COMPLEXITY: Low   GOALS: Goals reviewed with patient? Yes  SHORT TERM GOALS: Target date:  02/02/2024   Pt to be independent with initial HEP  Goal status: MET  2.  Pt to report decreased pain to 3/10   Goal status: MET    LONG TERM GOALS: Target date: 03/08/2024   Pt to be independent with final HEP  Goal status: INITIAL  2.  Pt to report decreased pain in hip and groin to 0-2/10 with soccer and work duties.   Goal status: INITIAL  3.  Pt to demo ability for bend, lift, squat as well a sit to stand without  pain or popping sensation.   Goal status: INITIAL  4.  Pt to demo rom and strength, stability  of bil hips to be [facility] , to improve ability for exercise, soccer and work duties .   Goal status: INITIAL    PLAN:  PT FREQUENCY: 1-2x/week  PT DURATION: 8 weeks  PLANNED INTERVENTIONS: Therapeutic exercises, Therapeutic activity, Neuromuscular re-education, Patient/Family education, Self Care, Joint mobilization, Joint manipulation, Stair training, Orthotic/Fit training, DME instructions, Aquatic Therapy, Dry Needling, Electrical stimulation, Cryotherapy, Moist heat, Taping, Ultrasound, Ionotophoresis 4mg /ml Dexamethasone, Manual therapy,  Vasopneumatic device, Traction, Spinal manipulation, Spinal mobilization,Balance training, Gait training,   PLAN FOR NEXT SESSION:    Tinnie Don, PT, DPT 10:00 AM  02/02/24

## 2024-02-04 ENCOUNTER — Other Ambulatory Visit

## 2024-02-09 ENCOUNTER — Encounter: Admitting: Physical Therapy

## 2024-02-14 ENCOUNTER — Ambulatory Visit: Admitting: Family Medicine

## 2024-02-14 ENCOUNTER — Encounter: Payer: Self-pay | Admitting: Family Medicine

## 2024-02-14 VITALS — BP 138/80 | HR 84 | Temp 100.4°F | Ht 66.0 in | Wt 131.0 lb

## 2024-02-14 DIAGNOSIS — J329 Chronic sinusitis, unspecified: Secondary | ICD-10-CM | POA: Diagnosis not present

## 2024-02-14 DIAGNOSIS — R509 Fever, unspecified: Secondary | ICD-10-CM

## 2024-02-14 DIAGNOSIS — B9689 Other specified bacterial agents as the cause of diseases classified elsewhere: Secondary | ICD-10-CM

## 2024-02-14 DIAGNOSIS — J4521 Mild intermittent asthma with (acute) exacerbation: Secondary | ICD-10-CM

## 2024-02-14 DIAGNOSIS — D802 Selective deficiency of immunoglobulin A [IgA]: Secondary | ICD-10-CM

## 2024-02-14 DIAGNOSIS — R051 Acute cough: Secondary | ICD-10-CM

## 2024-02-14 LAB — POCT INFLUENZA A/B
Influenza A, POC: NEGATIVE
Influenza B, POC: NEGATIVE

## 2024-02-14 LAB — POC COVID19 BINAXNOW: SARS Coronavirus 2 Ag: NEGATIVE

## 2024-02-14 MED ORDER — PREDNISONE 20 MG PO TABS: ORAL_TABLET | ORAL | 0 refills | Status: AC

## 2024-02-14 MED ORDER — ALBUTEROL SULFATE HFA 108 (90 BASE) MCG/ACT IN AERS: 2.0000 | INHALATION_SPRAY | Freq: Four times a day (QID) | RESPIRATORY_TRACT | 0 refills | Status: AC | PRN

## 2024-02-14 MED ORDER — DOXYCYCLINE HYCLATE 100 MG PO TABS: 100.0000 mg | ORAL_TABLET | Freq: Two times a day (BID) | ORAL | 0 refills | Status: AC

## 2024-02-14 NOTE — Patient Instructions (Addendum)
 I am not sure what your original infection was but your flu and COVID tests are negative today. It appears to me you have developed a bacterial sinus infection with ongoing drainage from your nasal passages  and I am going to treat you with doxycycline  to address that (avoid prolonged sun exposure). I do not hear pneumonia but doxycycline  provides some coverage for pneumonia thankfully. Start this doxycycline  antibiotic tonight  This appears to have flared up your asthma from childhood with extensive wheeze on exam. Take prednisone  each morning (start tomorrow morning) to help settle down the asthma.   Can use albuterol  inhaler for wheezing  If you develop shortness of breath, worsening fevers or you are not rapidly improving by the end of the week would recommend being seen again- I held off on x ray today but that would be a consideration.   Recommended follow up: Return for as needed for new, worsening, persistent symptoms.

## 2024-02-14 NOTE — Progress Notes (Signed)
 Phone [phone] In person visit   Subjective:   Bradley Esparza is a 23 y.o. year old very pleasant male patient who presents for/with See problem oriented charting Chief Complaint  Patient presents with   Cough    Post nasal drip; no headache or sore throat; Symptoms started day after Thanksgiving; Fever today in office 100.4   Nasal Congestion    Past Medical History-  Patient Active Problem List   Diagnosis Date Noted   Elevated alkaline phosphatase level 12/28/2023   Hyperalbuminemia 12/28/2023   Depressive disorder 12/27/2023   Hearing loss due to cerumen impaction, right 12/27/2023   Musculoskeletal disorder 12/27/2023   Pelvic pain 12/27/2023   Bulge in groin area 12/27/2023   Hyperlipidemia, mixed 12/27/2023   Polycythemia 12/27/2023   Tinnitus of both ears 12/22/2022   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 1 each 0   doxycycline  (VIBRA -TABS) 100 MG tablet Take 1 tablet (100 mg total) by mouth 2 (two) times daily for 7 days. 14 tablet 0   hydrocortisone  (ANUSOL -HC) 2.5 % rectal cream Place 1 Application rectally 2 (two) times daily as needed for hemorrhoids or anal itching. (Patient taking differently: Place 1 Application rectally 2 (two) times daily as needed for hemorrhoids or anal itching (As needed).) 30 g 0   predniSONE  (DELTASONE ) 20 MG tablet Take 2 pills for 3 days, 1 pill for 4 days 10 tablet 0   VITAMIN D  PO Take 125 mg by mouth daily.     No current facility-administered medications for this visit.     Objective:  BP 138/80 (BP Location: Left Arm, Patient Position:  Sitting, Cuff Size: Normal)   Pulse 84   Temp (!) 100.4 F (38 C) (Temporal)   Ht 5' 6 (1.676 m)   Wt 131 lb (59.4 kg)   SpO2 97%   BMI 21.14 kg/m  Gen: NAD, appears fatigued Bilateral turbinates erythematous and edematous with narrow narrow openings and yellow discharge noted, tympanic membrane is normal bilaterally some cerumen noted in canal, pharynx mildly erythematous with some yellow drainage noted CV: RRR no murmurs rubs or gallops Lungs: Diffuse wheeze but no crackles or rhonchi's and breath sounds are equal and nondiminished Abdomen: soft/nontender/nondistended/normal bowel sounds. No rebound or guarding.  Ext: no edema Skin: warm, dry  Results for orders placed or performed in visit on 02/14/24 (from the past 24 hours)  POC COVID-19     Status: None   Collection Time: 02/14/24  3:11 PM  Result Value Ref Range   SARS Coronavirus 2 Ag Negative Negative  POCT Influenza A/B     Status: None   Collection Time: 02/14/24  3:11 PM  Result Value Ref Range   Influenza A, POC Negative Negative   Influenza B, POC Negative Negative       Assessment and Plan   # Cough/nasal congestion S: Patient reports symptoms since the day after Thanksgiving.  Postnasal drip noted and nasal congestion- mainly yellow.  No headache or sore throat (at present- had a couple days after thanksgiving) or sinus pain.  Cough productive yellow sputum.  He has  a temperature today 100.4 but previously has not known he's run a fever. Some wheeze. Some chest congestion. Reports did have childhood asthma. Feels fatigued. Not sexually active- and in past has always used protection. No IV drug use.   Has tried mucinex, tessalon, dayquil with little improvement  He is predisposed to illness with noted IgA deficiency per Dr. Jesus Santa at grocery store- [employer] and could have had sick exposures but he reports his Mom has been ill with similar.  A/P: I am not sure what your original infection was but  your flu and COVID tests are negative today. It appears to me you have developed a bacterial sinus infection with ongoing drainage from your nasal passages  and I am going to treat you with doxycycline  to address that (avoid prolonged sun exposure). I do not hear pneumonia but doxycycline  provides some coverage for pneumonia thankfully. Start this doxycycline  antibiotic tonight.  We discussed that his IgA deficiency makes it more likely for him to have prolonged illness or to get illness in the first place and then certainly contributing.  This appears to have flared up your asthma (asthma exacerbation in patient previously quiescent) from childhood with extensive wheeze on exam. Take prednisone  each morning (start tomorrow morning) to help settle down the asthma.   Can use albuterol  for wheezing  If you develop shortness of breath, worsening fevers or you are not rapidly improving by the end of the week would recommend being seen again- I held off on x ray today but that would be a consideration.   We considered alternative diagnosis such as acute HIV but he denies ever having unprotected sex and then not being tested and denies any recent sexual intercourse or any drug use.    Recommended follow up: Return for as needed for new, worsening, persistent symptoms. Future Appointments  Date Time Provider Department Center  03/03/2024 10:45 AM Joane Artist RAMAN, MD [facility] None  12/27/2024  2:00 PM Jesus Bernardino MATSU, MD [facility]    Lab/Order associations:   ICD-10-CM   1. Bacterial sinusitis  J32.9    B96.89     2. Mild intermittent asthma with exacerbation  J45.21     3. Fever, unspecified fever cause  R50.9 POC COVID-19    POCT Influenza A/B    4. Acute cough  R05.1 POC COVID-19    POCT Influenza A/B    5. IgA deficiency (HCC)  D80.2       Meds ordered this encounter  Medications   albuterol  (VENTOLIN  HFA) 108 (90 Base) MCG/ACT inhaler    Sig: Inhale 2 puffs into the lungs  every 6 (six) hours as needed for wheezing or shortness of breath.    Dispense:  1 each    Refill:  0   predniSONE  (DELTASONE ) 20 MG tablet    Sig: Take 2 pills for 3 days, 1 pill for 4 days    Dispense:  10 tablet    Refill:  0   doxycycline  (VIBRA -TABS) 100 MG tablet    Sig: Take 1 tablet (100 mg total) by mouth 2 (two) times daily for 7 days.    Dispense:  14 tablet    Refill:  0    Return precautions advised.  Garnette Lukes, MD

## 2024-02-16 ENCOUNTER — Encounter: Admitting: Physical Therapy

## 2024-02-29 ENCOUNTER — Ambulatory Visit: Admitting: Family Medicine

## 2024-03-03 ENCOUNTER — Ambulatory Visit: Admitting: Family Medicine

## 2024-12-27 ENCOUNTER — Encounter: Admitting: Internal Medicine
